# Patient Record
Sex: Female | Born: 1990 | Race: White | Hispanic: No | Marital: Single | Smoking: Former smoker
Health system: Southern US, Community
[De-identification: ages and names within clinical notes are randomized; demographics above are authoritative.]

## PROBLEM LIST (undated history)

## (undated) DIAGNOSIS — A6000 Herpesviral infection of urogenital system, unspecified: Secondary | ICD-10-CM

## (undated) DIAGNOSIS — F419 Anxiety disorder, unspecified: Secondary | ICD-10-CM

## (undated) DIAGNOSIS — F32A Depression, unspecified: Secondary | ICD-10-CM

## (undated) DIAGNOSIS — F329 Major depressive disorder, single episode, unspecified: Secondary | ICD-10-CM

## (undated) HISTORY — DX: Depression, unspecified: F32.A

## (undated) HISTORY — DX: Anxiety disorder, unspecified: F41.9

## (undated) HISTORY — DX: Major depressive disorder, single episode, unspecified: F32.9

---

## 2008-08-14 HISTORY — PX: CHOLECYSTECTOMY: SHX55

## 2015-03-01 ENCOUNTER — Ambulatory Visit: Payer: Self-pay | Admitting: Physician Assistant

## 2015-03-01 ENCOUNTER — Encounter: Payer: Self-pay | Admitting: Physician Assistant

## 2015-03-01 VITALS — BP 92/66 | HR 76 | Temp 97.9°F | Ht 62.5 in | Wt 199.2 lb

## 2015-03-01 DIAGNOSIS — F17210 Nicotine dependence, cigarettes, uncomplicated: Secondary | ICD-10-CM

## 2015-03-01 DIAGNOSIS — Z131 Encounter for screening for diabetes mellitus: Secondary | ICD-10-CM

## 2015-03-01 DIAGNOSIS — E669 Obesity, unspecified: Secondary | ICD-10-CM

## 2015-03-01 DIAGNOSIS — Z13220 Encounter for screening for lipoid disorders: Secondary | ICD-10-CM

## 2015-03-01 DIAGNOSIS — F411 Generalized anxiety disorder: Secondary | ICD-10-CM

## 2015-03-01 DIAGNOSIS — F418 Other specified anxiety disorders: Secondary | ICD-10-CM | POA: Insufficient documentation

## 2015-03-01 DIAGNOSIS — M25562 Pain in left knee: Secondary | ICD-10-CM | POA: Insufficient documentation

## 2015-03-01 LAB — GLUCOSE, POCT (MANUAL RESULT ENTRY): POC Glucose: 93 mg/dl (ref 70–99)

## 2015-03-01 NOTE — Progress Notes (Signed)
BP 92/66 mmHg  Pulse 76  Temp(Src) 97.9 F (36.6 C)  Ht 5' 2.5" (1.588 m)  Wt 199 lb 4 oz (90.379 kg)  BMI 35.84 kg/m2  SpO2 98%   Subjective:    Patient ID: Kelli Barker, female    DOB: 05-Aug-1990, 25 y.o.   MRN: [id number]  HPI: Kelli Barker is a 25 y.o. female presenting on 03/01/2015 for New Patient (Initial Visit); Knee Pain; and Dental Problem   HPI  Chief Complaint  Patient presents with  . New Patient (Initial Visit)    last time pt saw a pcp was 8 years ago  . Knee Pain    L knee  . Dental Problem    Pt used to go to [facility] but she hasn't been there since May.  She stopped going there when her therapist left.  Says her anxiety has been flaring a bit.  Pt states knee pain off and on her whole life.  States worse since 2015.  Says it swells every day.  States throbs after waslking.  Pain on medial side, now -   hyurts medially when walking but at night aches laterally.  She has never had her knee seen/evaluated except once when she was 10 or 25 years old.  Relevant past medical, surgical, family and social history reviewed and updated as indicated. Interim medical history since our last visit reviewed. Allergies and medications reviewed and updated.   Current outpatient prescriptions:  .  ibuprofen (ADVIL,MOTRIN) 200 MG tablet, Take 800 mg by mouth daily., Disp: , Rfl:    Review of Systems  Constitutional: Negative for fever, chills, diaphoresis, appetite change, fatigue and unexpected weight change.  HENT: Positive for congestion, dental problem, sneezing and sore throat. Negative for drooling, ear pain, facial swelling, hearing loss, mouth sores, trouble swallowing and voice change.   Eyes: Negative for pain, discharge, redness, itching and visual disturbance.  Respiratory: Negative for cough, choking, shortness of breath and wheezing.   Cardiovascular: Positive for leg swelling. Negative for chest pain and palpitations.  Gastrointestinal: Negative  for vomiting, abdominal pain, diarrhea, constipation and blood in stool.  Endocrine: Positive for polydipsia. Negative for cold intolerance and heat intolerance.  Genitourinary: Negative for dysuria, hematuria and decreased urine volume.  Musculoskeletal: Negative for back pain, arthralgias and gait problem.  Skin: Negative for rash.  Allergic/Immunologic: Negative for environmental allergies.  Neurological: Positive for headaches. Negative for seizures, syncope and light-headedness.  Hematological: Negative for adenopathy.  Psychiatric/Behavioral: Positive for dysphoric mood. Negative for suicidal ideas and agitation. The patient is nervous/anxious.     Per HPI unless specifically indicated above     Objective:    BP 92/66 mmHg  Pulse 76  Temp(Src) 97.9 F (36.6 C)  Ht 5' 2.5" (1.588 m)  Wt 199 lb 4 oz (90.379 kg)  BMI 35.84 kg/m2  SpO2 98%  Wt Readings from Last 3 Encounters:  03/01/15 199 lb 4 oz (90.379 kg)    Physical Exam  Constitutional: She is oriented to person, place, and time. She appears well-developed and well-nourished.  HENT:  Head: Normocephalic and atraumatic.  Mouth/Throat: Oropharynx is clear and moist. No oropharyngeal exudate.  Eyes: Conjunctivae and EOM are normal. Pupils are equal, round, and reactive to light.  Neck: Neck supple. No thyromegaly present.  Cardiovascular: Normal rate and regular rhythm.   Pulses:      Dorsalis pedis pulses are 2+ on the right side, and 2+ on the left side.  Posterior tibial pulses are 2+ on the right side, and 2+ on the left side.  Pulmonary/Chest: Effort normal and breath sounds normal.  Abdominal: Soft. Bowel sounds are normal. She exhibits no mass. There is no hepatosplenomegaly. There is no tenderness.  Musculoskeletal: She exhibits no edema.       Left knee: She exhibits normal range of motion, no swelling, no effusion, no ecchymosis, no deformity and no erythema. Tenderness (mild lateral non-point tenderness)  found.  Lymphadenopathy:    She has no cervical adenopathy.  Neurological: She is alert and oriented to person, place, and time. Gait normal.  Skin: Skin is warm and dry.  Psychiatric: She has a normal mood and affect. Her behavior is normal.  Vitals reviewed.   Results for orders placed or performed in visit on 03/01/15  POCT Glucose (CBG)  Result Value Ref Range   POC Glucose 93 70 - 99 mg/dl      Assessment & Plan:   Encounter Diagnoses  Name Primary?  . Left knee pain Yes  . Anxiety state   . Obesity, unspecified   . Screening cholesterol level   . Screening for diabetes mellitus   . Cigarette nicotine dependence, uncomplicated     -pt to get Baseline labs drawn -she is given [facility] discount  application -put on Dental list -order xray L knee.  Counseled pt to use otc knee sleeve to provide support. Ice the knee after walking or other activity.  Discussed weight management which will help the knee also -encouraged pt to Return to [facility] for anxiety -pt counseled on contraception.  Needs to at least use condoms if not desiring pregnancy -counseled on smoking cessation -F/u 1 month

## 2015-03-29 ENCOUNTER — Ambulatory Visit: Payer: Self-pay | Admitting: Physician Assistant

## 2015-04-13 ENCOUNTER — Ambulatory Visit: Payer: Self-pay | Admitting: Physician Assistant

## 2015-10-07 LAB — BASIC METABOLIC PANEL: Glucose: 95 mg/dL

## 2015-11-29 DIAGNOSIS — Z139 Encounter for screening, unspecified: Secondary | ICD-10-CM

## 2015-11-29 NOTE — Congregational Nurse Program (Signed)
Patient had a scheduled appointment at 10 am to be give a flu vaccine as well as setting her up with a primary care provider. Patient vitals were 101/65 73 temperature of 98.2 via oral source.  Patient was given the vaccine information sheet, and both consent form one with the pictures and other with just wording. Flu vaccine was give on left upper arm deltoid area. Vaccine information: LOT: [id number]  MFG: [employer] NDC: [id number] Expires: 06/20/16. [facility] and [facility] was made for 12/05/15 at 8:30 am.    Elane FritzBlanca R. Cheyann Blecha LPN [phone]

## 2015-12-05 ENCOUNTER — Encounter: Payer: Self-pay | Admitting: Physician Assistant

## 2015-12-05 ENCOUNTER — Ambulatory Visit: Payer: Self-pay | Admitting: Physician Assistant

## 2015-12-05 VITALS — BP 100/64 | HR 77 | Temp 98.1°F | Ht 62.5 in | Wt 182.5 lb

## 2015-12-05 DIAGNOSIS — Z130 Encounter for screening for diseases of the blood and blood-forming organs and certain disorders involving the immune mechanism: Secondary | ICD-10-CM

## 2015-12-05 DIAGNOSIS — Z6832 Body mass index (BMI) 32.0-32.9, adult: Principal | ICD-10-CM

## 2015-12-05 DIAGNOSIS — Z131 Encounter for screening for diabetes mellitus: Secondary | ICD-10-CM

## 2015-12-05 DIAGNOSIS — Z13220 Encounter for screening for lipoid disorders: Secondary | ICD-10-CM

## 2015-12-05 DIAGNOSIS — E669 Obesity, unspecified: Secondary | ICD-10-CM

## 2015-12-05 LAB — CBC WITH DIFFERENTIAL/PLATELET
BASOS PCT: 0 %
Basophils Absolute: 0 cells/uL (ref 0–200)
EOS ABS: 183 {cells}/uL (ref 15–500)
Eosinophils Relative: 3 %
HCT: 36.9 % (ref 35.0–45.0)
Hemoglobin: 12.1 g/dL (ref 11.7–15.5)
Lymphocytes Relative: 23 %
Lymphs Abs: 1403 cells/uL (ref 850–3900)
MCH: 27.6 pg (ref 27.0–33.0)
MCHC: 32.8 g/dL (ref 32.0–36.0)
MCV: 84.1 fL (ref 80.0–100.0)
MONO ABS: 366 {cells}/uL (ref 200–950)
MONOS PCT: 6 %
MPV: 10.7 fL (ref 7.5–12.5)
NEUTROS ABS: 4148 {cells}/uL (ref 1500–7800)
Neutrophils Relative %: 68 %
PLATELETS: 206 10*3/uL (ref 140–400)
RBC: 4.39 MIL/uL (ref 3.80–5.10)
RDW: 13.5 % (ref 11.0–15.0)
WBC: 6.1 10*3/uL (ref 3.8–10.8)

## 2015-12-05 LAB — COMPREHENSIVE METABOLIC PANEL
ALBUMIN: 4.3 g/dL (ref 3.6–5.1)
ALT: 8 U/L (ref 6–29)
AST: 11 U/L (ref 10–30)
Alkaline Phosphatase: 33 U/L (ref 33–115)
BUN: 15 mg/dL (ref 7–25)
CALCIUM: 9.2 mg/dL (ref 8.6–10.2)
CHLORIDE: 105 mmol/L (ref 98–110)
CO2: 23 mmol/L (ref 20–31)
Creat: 0.72 mg/dL (ref 0.50–1.10)
Glucose, Bld: 82 mg/dL (ref 65–99)
Potassium: 4.2 mmol/L (ref 3.5–5.3)
Sodium: 137 mmol/L (ref 135–146)
Total Bilirubin: 0.6 mg/dL (ref 0.2–1.2)
Total Protein: 6.6 g/dL (ref 6.1–8.1)

## 2015-12-05 LAB — LIPID PANEL
CHOL/HDL RATIO: 3.2 ratio (ref <5.0)
Cholesterol: 135 mg/dL (ref <200)
HDL: 42 mg/dL — AB (ref >50)
LDL Cholesterol: 78 mg/dL (ref <100)
TRIGLYCERIDES: 74 mg/dL (ref <150)
VLDL: 15 mg/dL (ref <30)

## 2015-12-05 LAB — HEMOGLOBIN A1C
HEMOGLOBIN A1C: 4.9 % (ref <5.7)
Mean Plasma Glucose: 94 mg/dL

## 2015-12-05 LAB — TSH: TSH: 2.11 mIU/L

## 2015-12-05 NOTE — Progress Notes (Signed)
BP 100/64 (BP Location: Left Arm, Patient Position: Sitting, Cuff Size: Normal)   Pulse 77   Temp 98.1 F (36.7 C) (Other (Comment))   Ht 5' 2.5" (1.588 m)   Wt 182 lb 8 oz (82.8 kg)   LMP 11/22/2015 (Approximate)   SpO2 98%   BMI 32.85 kg/m    Subjective:    Patient ID: Kelli Barker, female    DOB: 1990/04/22, 25 y.o.   MRN: [id number]  HPI: Kelli Barker is a 25 y.o. female presenting on 12/05/2015 for New Patient (Initial Visit) (new/old patient)   HPI -Pt seen here in February but pt never got labs or followed up. -[facility] -No longer having knee pain -No longer having anxiety -she Quit smoking earlier this year -She is working on weight loss with nutritious diet and exercise  Relevant past medical, surgical, family and social history reviewed and updated as indicated. Interim medical history since our last visit reviewed. Allergies and medications reviewed and updated.   Current Outpatient Prescriptions:  .  ibuprofen (ADVIL,MOTRIN) 200 MG tablet, Take 800 mg by mouth daily as needed. , Disp: , Rfl:   Review of Systems  Constitutional: Negative for appetite change, chills, diaphoresis, fatigue, fever and unexpected weight change.  HENT: Positive for dental problem, sneezing and sore throat. Negative for congestion, drooling, ear pain, facial swelling, hearing loss, mouth sores, trouble swallowing and voice change.   Eyes: Negative for pain, discharge, redness, itching and visual disturbance.  Respiratory: Positive for cough. Negative for choking, shortness of breath and wheezing.   Cardiovascular: Negative for chest pain, palpitations and leg swelling.  Gastrointestinal: Negative for abdominal pain, blood in stool, constipation, diarrhea and vomiting.  Endocrine: Negative for cold intolerance, heat intolerance and polydipsia.  Genitourinary: Negative for decreased urine volume, dysuria and hematuria.  Musculoskeletal: Negative for arthralgias,  back pain and gait problem.  Skin: Negative for rash.  Allergic/Immunologic: Negative for environmental allergies.  Neurological: Negative for seizures, syncope, light-headedness and headaches.  Hematological: Negative for adenopathy.  Psychiatric/Behavioral: Negative for agitation, dysphoric mood and suicidal ideas. The patient is nervous/anxious.     Per HPI unless specifically indicated above     Objective:    BP 100/64 (BP Location: Left Arm, Patient Position: Sitting, Cuff Size: Normal)   Pulse 77   Temp 98.1 F (36.7 C) (Other (Comment))   Ht 5' 2.5" (1.588 m)   Wt 182 lb 8 oz (82.8 kg)   LMP 11/22/2015 (Approximate)   SpO2 98%   BMI 32.85 kg/m   Wt Readings from Last 3 Encounters:  12/05/15 182 lb 8 oz (82.8 kg)  11/29/15 187 lb (84.8 kg)  03/01/15 199 lb 4 oz (90.4 kg)    Physical Exam  Constitutional: She is oriented to person, place, and time. She appears well-developed and well-nourished.  HENT:  Head: Normocephalic and atraumatic.  Mouth/Throat: Oropharynx is clear and moist. No oropharyngeal exudate.  Eyes: Conjunctivae and EOM are normal. Pupils are equal, round, and reactive to light.  Neck: Neck supple. No thyromegaly present.  Cardiovascular: Normal rate and regular rhythm.   Pulmonary/Chest: Effort normal and breath sounds normal.  Abdominal: Soft. Bowel sounds are normal. She exhibits no mass. There is no hepatosplenomegaly. There is no tenderness.  Musculoskeletal: She exhibits no edema.  Lymphadenopathy:    She has no cervical adenopathy.  Neurological: She is alert and oriented to person, place, and time. Gait normal.  Skin: Skin is warm and dry.  Psychiatric: She  has a normal mood and affect. Her behavior is normal.  Vitals reviewed.   Results for orders placed or performed in visit on 10/07/15  Basic metabolic panel  Result Value Ref Range   Glucose 95 mg/dL      Assessment & Plan:    Encounter Diagnoses  Name Primary?  . Class 1  obesity with body mass index (BMI) of 32.0 to 32.9 in adult, unspecified obesity type, unspecified whether serious comorbidity present Yes  . Screening cholesterol level   . Screening for diabetes mellitus   . Screening, anemia, deficiency, iron     -get baseline labs. Will call with results -[facility] -follow up 6 months.  RTO sooner prn

## 2016-01-13 ENCOUNTER — Emergency Department (HOSPITAL_COMMUNITY)
Admission: EM | Admit: 2016-01-13 | Discharge: 2016-01-13 | Disposition: A | Discharge status: Home / Self Care | Payer: Self-pay | Attending: Emergency Medicine | Admitting: Emergency Medicine

## 2016-01-13 ENCOUNTER — Encounter (HOSPITAL_COMMUNITY): Payer: Self-pay | Admitting: *Deleted

## 2016-01-13 DIAGNOSIS — Z87891 Personal history of nicotine dependence: Secondary | ICD-10-CM | POA: Insufficient documentation

## 2016-01-13 DIAGNOSIS — J069 Acute upper respiratory infection, unspecified: Secondary | ICD-10-CM | POA: Insufficient documentation

## 2016-01-13 DIAGNOSIS — H9201 Otalgia, right ear: Secondary | ICD-10-CM

## 2016-01-13 MED ORDER — GUAIFENESIN-CODEINE 100-10 MG/5ML PO SYRP: 10.0000 mL | ORAL_SOLUTION | Freq: Three times a day (TID) | ORAL | 0 refills | Status: DC | PRN

## 2016-01-13 MED ORDER — PREDNISONE 10 MG PO TABS: ORAL_TABLET | ORAL | 0 refills | Status: DC

## 2016-01-13 NOTE — ED Triage Notes (Signed)
Pt is her for right ear pain which occurred suddenly today right after she heard a pop in her ear (?from jaw/tmj?).  Pt now reports pain in right ear radiating to right jaw. No fever or chills

## 2016-01-13 NOTE — Discharge Instructions (Signed)
Take sudafed as directed.  Plenty of fluids.  Follow-up with your doctor for recheck

## 2016-01-13 NOTE — ED Notes (Signed)
Pt with cold symptoms since 12/26-nasal congestion, cough, sinus and ear pressure esp to right ear.  Today right ear popped and pain since along her right jaw

## 2016-01-17 NOTE — ED Provider Notes (Signed)
[facility]-EMERGENCY DEPT Provider Note   CSN: [id number] Arrival date & time: 01/13/16  1301     History   Chief Complaint Chief Complaint  Patient presents with  . Ear Pain    HPI Kelli Barker is a 26 y.o. female.  HPI  Kelli Barker is a 27 y.o. female who presents to the Emergency Department complaining of nasal congestion, cough, ear pain and pressure for several days.  She states that she felt a sudden pop to her ear shortly prior to arrival.  She describes a sharp pain along her ear and right jaw since then.  She denies fever, chills, neck pain and facial swelling  Past Medical History:  Diagnosis Date  . Anxiety   . Depression     Patient Active Problem List   Diagnosis Date Noted  . Left knee pain 03/01/2015  . Anxiety state 03/01/2015  . Obesity, unspecified 03/01/2015  . Cigarette nicotine dependence, uncomplicated 03/01/2015    Past Surgical History:  Procedure Laterality Date  . CHOLECYSTECTOMY  08-2008    OB History    No data available       Home Medications    Prior to Admission medications   Medication Sig Start Date End Date Taking? Authorizing Provider  guaiFENesin-codeine (ROBITUSSIN AC) 100-10 MG/5ML syrup Take 10 mLs by mouth 3 (three) times daily as needed. 01/13/16   Odelle Kosier, PA-C  ibuprofen (ADVIL,MOTRIN) 200 MG tablet Take 800 mg by mouth daily as needed.     Historical Provider, MD  predniSONE (DELTASONE) 10 MG tablet Take 6 tablets day one, 5 tablets day two, 4 tablets day three, 3 tablets day four, 2 tablets day five, then 1 tablet day six 01/13/16   Pauline Aus, PA-C    Family History Family History  Problem Relation Age of Onset  . COPD Father   . Hypertension Maternal Grandfather     Social History Social History  Substance Use Topics  . Smoking status: Former Smoker    Years: 10.00    Types: Cigarettes    Quit date: 01/15/2015  . Smokeless tobacco: Never Used  . Alcohol use No     Allergies   Sulfa  antibiotics   Review of Systems Review of Systems  Constitutional: Negative for activity change, appetite change, chills and fever.  HENT: Positive for congestion, ear pain, rhinorrhea and sore throat. Negative for facial swelling and trouble swallowing.   Eyes: Negative for visual disturbance.  Respiratory: Positive for cough. Negative for shortness of breath, wheezing and stridor.   Gastrointestinal: Negative for abdominal pain, nausea and vomiting.  Musculoskeletal: Negative for neck pain and neck stiffness.  Skin: Negative.   Neurological: Negative for dizziness, weakness, numbness and headaches.  Hematological: Negative for adenopathy.  Psychiatric/Behavioral: Negative for confusion.  All other systems reviewed and are negative.    Physical Exam Updated Vital Signs BP 95/56 (BP Location: Right Arm)   Pulse 76   Temp 98.4 F (36.9 C) (Oral)   Resp 16   Wt 84.8 kg   LMP 01/03/2016 (Approximate)   SpO2 100%   BMI 33.66 kg/m   Physical Exam  Constitutional: She is oriented to person, place, and time. She appears well-developed and well-nourished. No distress.  HENT:  Head: Normocephalic and atraumatic.  Right Ear: Ear canal normal. Tympanic membrane is retracted. No middle ear effusion.  Left Ear: Tympanic membrane and ear canal normal.  Nose: Mucosal edema and rhinorrhea present.  Mouth/Throat: Uvula is midline and mucous membranes are normal.  No trismus in the jaw. No uvula swelling. Posterior oropharyngeal erythema present. No oropharyngeal exudate, posterior oropharyngeal edema or tonsillar abscesses.  Eyes: Conjunctivae are normal.  Neck: Normal range of motion and phonation normal. Neck supple. No Brudzinski's sign and no Kernig's sign noted.  Cardiovascular: Normal rate, regular rhythm, normal heart sounds and intact distal pulses.   No murmur heard. Pulmonary/Chest: Effort normal and breath sounds normal. No respiratory distress. She has no wheezes. She has no  rales.  Abdominal: Soft. She exhibits no distension. There is no tenderness. There is no rebound and no guarding.  Musculoskeletal: She exhibits no edema.  Lymphadenopathy:    She has no cervical adenopathy.  Neurological: She is alert and oriented to person, place, and time. She exhibits normal muscle tone. Coordination normal.  Skin: Skin is warm and dry.  Nursing note and vitals reviewed.    ED Treatments / Results  Labs (all labs ordered are listed, but only abnormal results are displayed) Labs Reviewed - No data to display  EKG  EKG Interpretation None       Radiology No results found.  Procedures Procedures (including critical care time)  Medications Ordered in ED Medications - No data to display   Initial Impression / Assessment and Plan / ED Course  I have reviewed the triage vital signs and the nursing notes.  Pertinent labs & imaging results that were available during my care of the patient were reviewed by me and considered in my medical decision making (see chart for details).  Clinical Course    Pt well appearing, non-toxic.  Vitals stable.  Sx's likely related to URI.  No OM.  no mastoid tenderness.  Pt agrees to symptomatic tx.    Final Clinical Impressions(s) / ED Diagnoses   Final diagnoses:  Otalgia of right ear  Upper respiratory tract infection, unspecified type    New Prescriptions Discharge Medication List as of 01/13/2016  3:22 PM    START taking these medications   Details  guaiFENesin-codeine (ROBITUSSIN AC) 100-10 MG/5ML syrup Take 10 mLs by mouth 3 (three) times daily as needed., Starting Sat 01/13/2016, Print    predniSONE (DELTASONE) 10 MG tablet Take 6 tablets day one, 5 tablets day two, 4 tablets day three, 3 tablets day four, 2 tablets day five, then 1 tablet day six, Print         Pauline Ausammy Gwendlyn Hanback, PA-C 01/17/16 2153    Maia PlanJoshua G Long, MD 01/18/16 [phone]

## 2016-01-30 ENCOUNTER — Ambulatory Visit: Payer: Self-pay | Admitting: Physician Assistant

## 2016-01-30 ENCOUNTER — Encounter: Payer: Self-pay | Admitting: Physician Assistant

## 2016-01-30 VITALS — BP 102/74 | HR 93 | Temp 98.1°F | Ht 62.5 in | Wt 184.5 lb

## 2016-01-30 DIAGNOSIS — J069 Acute upper respiratory infection, unspecified: Secondary | ICD-10-CM

## 2016-01-30 DIAGNOSIS — K0889 Other specified disorders of teeth and supporting structures: Secondary | ICD-10-CM

## 2016-01-30 MED ORDER — AMOXICILLIN 500 MG PO CAPS: 500.0000 mg | ORAL_CAPSULE | Freq: Three times a day (TID) | ORAL | 0 refills | Status: AC

## 2016-01-30 MED ORDER — BENZONATATE 100 MG PO CAPS: ORAL_CAPSULE | ORAL | 3 refills | Status: AC

## 2016-01-30 NOTE — Patient Instructions (Signed)
Upper Respiratory Infection, Adult Most upper respiratory infections (URIs) are a viral infection of the air passages leading to the lungs. A URI affects the nose, throat, and upper air passages. The most common type of URI is nasopharyngitis and is typically referred to as "the common cold." URIs run their course and usually go away on their own. Most of the time, a URI does not require medical attention, but sometimes a bacterial infection in the upper airways can follow a viral infection. This is called a secondary infection. Sinus and middle ear infections are common types of secondary upper respiratory infections. Bacterial pneumonia can also complicate a URI. A URI can worsen asthma and chronic obstructive pulmonary disease (COPD). Sometimes, these complications can require emergency medical care and may be life threatening. What are the causes? Almost all URIs are caused by viruses. A virus is a type of germ and can spread from one person to another. What increases the risk? You may be at risk for a URI if:  You smoke.  You have chronic heart or lung disease.  You have a weakened defense (immune) system.  You are very young or very old.  You have nasal allergies or asthma.  You work in crowded or poorly ventilated areas.  You work in health care facilities or schools.  What are the signs or symptoms? Symptoms typically develop 2-3 days after you come in contact with a cold virus. Most viral URIs last 7-10 days. However, viral URIs from the influenza virus (flu virus) can last 14-18 days and are typically more severe. Symptoms may include:  Runny or stuffy (congested) nose.  Sneezing.  Cough.  Sore throat.  Headache.  Fatigue.  Fever.  Loss of appetite.  Pain in your forehead, behind your eyes, and over your cheekbones (sinus pain).  Muscle aches.  How is this diagnosed? Your health care provider may diagnose a URI by:  Physical exam.  Tests to check that your  symptoms are not due to another condition such as: ? Strep throat. ? Sinusitis. ? Pneumonia. ? Asthma.  How is this treated? A URI goes away on its own with time. It cannot be cured with medicines, but medicines may be prescribed or recommended to relieve symptoms. Medicines may help:  Reduce your fever.  Reduce your cough.  Relieve nasal congestion.  Follow these instructions at home:  Take medicines only as directed by your health care provider.  Gargle warm saltwater or take cough drops to comfort your throat as directed by your health care provider.  Use a warm mist humidifier or inhale steam from a shower to increase air moisture. This may make it easier to breathe.  Drink enough fluid to keep your urine clear or pale yellow.  Eat soups and other clear broths and maintain good nutrition.  Rest as needed.  Return to work when your temperature has returned to normal or as your health care provider advises. You may need to stay home longer to avoid infecting others. You can also use a face mask and careful hand washing to prevent spread of the virus.  Increase the usage of your inhaler if you have asthma.  Do not use any tobacco products, including cigarettes, chewing tobacco, or electronic cigarettes. If you need help quitting, ask your health care provider. How is this prevented? The best way to protect yourself from getting a cold is to practice good hygiene.  Avoid oral or hand contact with people with cold symptoms.  Wash your   hands often if contact occurs.  There is no clear evidence that vitamin C, vitamin E, echinacea, or exercise reduces the chance of developing a cold. However, it is always recommended to get plenty of rest, exercise, and practice good nutrition. Contact a health care provider if:  You are getting worse rather than better.  Your symptoms are not controlled by medicine.  You have chills.  You have worsening shortness of breath.  You have  brown or red mucus.  You have yellow or brown nasal discharge.  You have pain in your face, especially when you bend forward.  You have a fever.  You have swollen neck glands.  You have pain while swallowing.  You have white areas in the back of your throat. Get help right away if:  You have severe or persistent: ? Headache. ? Ear pain. ? Sinus pain. ? Chest pain.  You have chronic lung disease and any of the following: ? Wheezing. ? Prolonged cough. ? Coughing up blood. ? A change in your usual mucus.  You have a stiff neck.  You have changes in your: ? Vision. ? Hearing. ? Thinking. ? Mood. This information is not intended to replace advice given to you by your health care provider. Make sure you discuss any questions you have with your health care provider. Document Released: 06/26/2000 Document Revised: 09/03/2015 Document Reviewed: 04/07/2013 Elsevier Interactive Patient Education  2017 [employer]  

## 2016-01-30 NOTE — Progress Notes (Signed)
BP 102/74 (BP Location: Left Arm, Patient Position: Sitting, Cuff Size: Large)   Pulse 93   Temp 98.1 F (36.7 C) (Other (Comment))   Ht 5' 2.5" (1.588 m)   Wt 184 lb 8 oz (83.7 kg)   LMP 01/22/2016 (Approximate)   SpO2 97%   BMI 33.21 kg/m    Subjective:    Patient ID: Kelli Barker, female    DOB: 01/04/1991, 26 y.o.   MRN: [id number]  HPI: Kelli BelfastJessica Hogenson is a 26 y.o. female presenting on 01/30/2016 for Facial Pain (c/o pain to right jaw and face since broke tooth several weeks ago, [facility],) and Cough (also had cough producing green mucus, and headache, some relief from pain and headache with excedrin migraine)   HPI   Chief Complaint  Patient presents with  . Facial Pain    c/o pain to right jaw and face since broke tooth several weeks ago, [facility],  . Cough    also had cough producing green mucus, and headache, some relief from pain and headache with excedrin migraine     Pt seen in ER for same  Relevant past medical, surgical, family and social history reviewed and updated as indicated. Interim medical history since our last visit reviewed. Allergies and medications reviewed and updated.  . Current Outpatient Prescriptions:  .  aspirin-acetaminophen-caffeine (EXCEDRIN MIGRAINE) 250-250-65 MG tablet, Take 2 tablets by mouth daily as needed for headache., Disp: , Rfl:  .  ibuprofen (ADVIL,MOTRIN) 200 MG tablet, Take 800 mg by mouth daily as needed. , Disp: , Rfl:  .  phenylephrine (SUDAFED PE) 10 MG TABS tablet, Take 10 mg by mouth every morning., Disp: , Rfl:    Review of Systems  Constitutional: Negative for appetite change, chills, diaphoresis, fatigue, fever and unexpected weight change.  HENT: Positive for dental problem, ear pain, sneezing and sore throat. Negative for congestion, drooling, facial swelling, hearing loss, mouth sores, trouble swallowing and voice change.   Eyes: Positive for itching. Negative for pain,  discharge, redness and visual disturbance.  Respiratory: Positive for cough. Negative for choking, shortness of breath and wheezing.   Cardiovascular: Negative for chest pain, palpitations and leg swelling.  Gastrointestinal: Negative for abdominal pain, blood in stool, constipation, diarrhea and vomiting.  Endocrine: Negative for cold intolerance, heat intolerance and polydipsia.  Genitourinary: Negative for decreased urine volume, dysuria and hematuria.  Musculoskeletal: Negative for arthralgias, back pain and gait problem.  Skin: Negative for rash.  Allergic/Immunologic: Negative for environmental allergies.  Neurological: Positive for headaches. Negative for seizures, syncope and light-headedness.  Hematological: Negative for adenopathy.  Psychiatric/Behavioral: Negative for agitation, dysphoric mood and suicidal ideas. The patient is not nervous/anxious.     Per HPI unless specifically indicated above     Objective:    BP 102/74 (BP Location: Left Arm, Patient Position: Sitting, Cuff Size: Large)   Pulse 93   Temp 98.1 F (36.7 C) (Other (Comment))   Ht 5' 2.5" (1.588 m)   Wt 184 lb 8 oz (83.7 kg)   LMP 01/22/2016 (Approximate)   SpO2 97%   BMI 33.21 kg/m   Wt Readings from Last 3 Encounters:  01/30/16 184 lb 8 oz (83.7 kg)  01/13/16 187 lb (84.8 kg)  12/05/15 182 lb 8 oz (82.8 kg)    Physical Exam  Constitutional: She is oriented to person, place, and time. She appears well-developed and well-nourished.  HENT:  Head: Normocephalic and atraumatic.  Right  Ear: Hearing, tympanic membrane, external ear and ear canal normal.  Left Ear: Hearing, tympanic membrane, external ear and ear canal normal.  Nose: Mucosal edema and rhinorrhea present.  Mouth/Throat: Uvula is midline and oropharynx is clear and moist. No trismus in the jaw. Abnormal dentition. Dental caries present. No dental abscesses or uvula swelling. No oropharyngeal exudate.  Neck: Neck supple.  Cardiovascular:  Normal rate and regular rhythm.   Pulmonary/Chest: Effort normal and breath sounds normal. She has no wheezes.  Lymphadenopathy:    She has no cervical adenopathy.  Neurological: She is alert and oriented to person, place, and time.  Skin: Skin is warm and dry.  Psychiatric: She has a normal mood and affect. Her behavior is normal.  Vitals reviewed.       Assessment & Plan:   Encounter Diagnoses  Name Primary?  . Acute upper respiratory infection Yes  . Dentalgia     -rx amoxil and tessalon -encouraged rest, fluids -discussed with pt that she needs to see dentist.  Discussed that antibiotic should resolve any bacterial sinusitis as well as any infection in her teeth, but that she needs dentist for definitive treatment of her tooth problems. -follow up may as scheduled.  RTO soooner prn

## 2016-05-02 ENCOUNTER — Emergency Department (HOSPITAL_COMMUNITY)
Admission: EM | Admit: 2016-05-02 | Discharge: 2016-05-02 | Disposition: A | Discharge status: Home / Self Care | Payer: Self-pay | Attending: Emergency Medicine | Admitting: Emergency Medicine

## 2016-05-02 ENCOUNTER — Encounter (HOSPITAL_COMMUNITY): Payer: Self-pay | Admitting: Emergency Medicine

## 2016-05-02 DIAGNOSIS — Z79899 Other long term (current) drug therapy: Secondary | ICD-10-CM | POA: Insufficient documentation

## 2016-05-02 DIAGNOSIS — K0889 Other specified disorders of teeth and supporting structures: Secondary | ICD-10-CM | POA: Insufficient documentation

## 2016-05-02 DIAGNOSIS — Z87891 Personal history of nicotine dependence: Secondary | ICD-10-CM | POA: Insufficient documentation

## 2016-05-02 MED ORDER — PENICILLIN V POTASSIUM 250 MG PO TABS
500.0000 mg | ORAL_TABLET | Freq: Once | ORAL | Status: AC
  Administered 2016-05-02: 500 mg via ORAL
  Filled 2016-05-02: qty 2

## 2016-05-02 MED ORDER — PENICILLIN V POTASSIUM 500 MG PO TABS: 500.0000 mg | ORAL_TABLET | Freq: Four times a day (QID) | ORAL | 0 refills | Status: AC

## 2016-05-02 MED ORDER — HYDROCODONE-ACETAMINOPHEN 5-325 MG PO TABS
1.0000 | ORAL_TABLET | Freq: Once | ORAL | Status: AC
  Administered 2016-05-02: 1 via ORAL
  Filled 2016-05-02: qty 1

## 2016-05-02 NOTE — ED Provider Notes (Signed)
[facility]-EM[facility] DEPT Provider Note   CSN: [id number] Arrival date & time: 05/02/16  2228  By signing my name below, I, Doreatha Martin, attest that this documentation has been prepared under the direction and in the presence of Zadie Rhine, MD. Electronically Signed: Doreatha Martin, ED Scribe. 05/02/16. 11:36 PM.     History   Chief Complaint Chief Complaint  Patient presents with  . Dental Pain    HPI Kelli Barker is a 26 y.o. female who presents to the Emergency Department complaining of constant, gradually worsening right lower dental pain that began last night. Per pt, her pain is worsened with swallowing and talking. Pt states she has taken ibuprofen with no relief. She notes h/o similar pain that was treated with antibiotics. She denies fever, vomiting, difficulty swallowing or tolerating secretions. NKDA.   The history is provided by the patient. No language interpreter was used.  Dental Pain   This is a recurrent problem. The current episode started 2 days ago. The problem occurs constantly. The problem has been gradually worsening. The pain is moderate. Treatments tried: Motrin. The treatment provided no relief.    Past Medical History:  Diagnosis Date  . Anxiety   . Depression     Patient Active Problem List   Diagnosis Date Noted  . Left knee pain 03/01/2015  . Anxiety state 03/01/2015  . Obesity, unspecified 03/01/2015  . Cigarette nicotine dependence, uncomplicated 03/01/2015    Past Surgical History:  Procedure Laterality Date  . CHOLECYSTECTOMY  08-2008    OB History    No data available       Home Medications    Prior to Admission medications   Medication Sig Start Date End Date Taking? Authorizing Provider  Aspirin-Salicylamide-Caffeine (BC HEADACHE) 325-95-16 MG TABS Take 1 Package by mouth once as needed (for dental pain).   Yes Historical Provider, MD  ibuprofen (ADVIL,MOTRIN) 200 MG tablet Take 800 mg by mouth daily as needed.    Yes  Historical Provider, MD    Family History Family History  Problem Relation Age of Onset  . COPD Father   . Hypertension Maternal Grandfather     Social History Social History  Substance Use Topics  . Smoking status: Former Smoker    Years: 10.00    Types: Cigarettes    Quit date: 01/15/2015  . Smokeless tobacco: Never Used  . Alcohol use No     Allergies   Sulfa antibiotics   Review of Systems Review of Systems  Constitutional: Negative for fever.  HENT: Positive for dental problem. Negative for trouble swallowing.   Gastrointestinal: Negative for vomiting.     Physical Exam Updated Vital Signs BP 121/61 (BP Location: Left Arm)   Pulse 78   Temp 98.8 F (37.1 C) (Oral)   Resp 20   Ht  (1.575 m)   Wt 180 lb (81.6 kg)   LMP 05/01/2016   SpO2 100%   BMI 32.92 kg/m   Physical Exam CONSTITUTIONAL: Well developed/well nourished HEAD AND FACE: Normocephalic/atraumatic EYES: EOMI/PERRL ENMT: Mucous membranes moist.  Poor dentition.  No trismus.  No focal abscess noted. Tenderness to right lower molar.  NECK: supple no meningeal signs CV: S1/S2 noted, no murmurs/rubs/gallops noted LUNGS: Lungs are clear to auscultation bilaterally ABDOMEN: soft NEURO: Pt is awake/alert, moves all extremitiesx4 EXTREMITIES:full ROM SKIN: warm, color normal   ED Treatments / Results   DIAGNOSTIC STUDIES: Oxygen Saturation is 100% on RA, normal by my interpretation.    COORDINATION OF CARE:  11:33 PM Discussed treatment plan with pt at bedside which includes Penicillin and pt agreed to plan.    Labs (all labs ordered are listed, but only abnormal results are displayed) Labs Reviewed - No data to display  EKG  EKG Interpretation None       Radiology No results found.  Procedures Procedures  Medications Ordered in ED Medications  penicillin v potassium (VEETID) tablet 500 mg (500 mg Oral Given 05/02/16 2346)  HYDROcodone-acetaminophen (NORCO/VICODIN) 5-325  MG per tablet 1 tablet (1 tablet Oral Given 05/02/16 2346)     Initial Impression / Assessment and Plan / ED Course  I have reviewed the triage vital signs and the nursing notes.    Final Clinical Impressions(s) / ED Diagnoses   Final diagnoses:  Pain, dental    New Prescriptions Discharge Medication List as of 05/02/2016 11:38 PM    START taking these medications   Details  penicillin v potassium (VEETID) 500 MG tablet Take 1 tablet (500 mg total) by mouth 4 (four) times daily., Starting Thu 05/02/2016, Until Thu 05/09/2016, Print        I personally performed the services described in this documentation, which was scribed in my presence. The recorded information has been reviewed and is accurate.        Zadie Rhine, MD 05/03/16 [id number]

## 2016-05-02 NOTE — ED Notes (Signed)
Pt c/o right side lower toothache that started last night, worse today, has been taking ibuprofen without any relief,

## 2016-05-02 NOTE — ED Triage Notes (Signed)
Pt states she was seen a couple of months ago for dental pain.  Antibiotics made the pain go away and she did not follow up with a dentist.  Pain started again last night

## 2016-06-04 ENCOUNTER — Ambulatory Visit: Payer: Self-pay | Admitting: Physician Assistant

## 2016-06-23 ENCOUNTER — Encounter (HOSPITAL_COMMUNITY): Payer: Self-pay | Admitting: Emergency Medicine

## 2016-06-23 ENCOUNTER — Emergency Department (HOSPITAL_COMMUNITY)
Admission: EM | Admit: 2016-06-23 | Discharge: 2016-06-24 | Disposition: A | Discharge status: Home / Self Care | Payer: Self-pay | Attending: Emergency Medicine | Admitting: Emergency Medicine

## 2016-06-23 DIAGNOSIS — K0889 Other specified disorders of teeth and supporting structures: Secondary | ICD-10-CM

## 2016-06-23 DIAGNOSIS — K029 Dental caries, unspecified: Secondary | ICD-10-CM | POA: Insufficient documentation

## 2016-06-23 DIAGNOSIS — Z87891 Personal history of nicotine dependence: Secondary | ICD-10-CM | POA: Insufficient documentation

## 2016-06-23 MED ORDER — AMOXICILLIN 500 MG PO CAPS: 500.0000 mg | ORAL_CAPSULE | Freq: Three times a day (TID) | ORAL | 0 refills | Status: DC

## 2016-06-23 MED ORDER — ACETAMINOPHEN 500 MG PO TABS
1000.0000 mg | ORAL_TABLET | Freq: Once | ORAL | Status: AC
  Administered 2016-06-23: 1000 mg via ORAL
  Filled 2016-06-23: qty 2

## 2016-06-23 MED ORDER — PROMETHAZINE HCL 12.5 MG PO TABS
12.5000 mg | ORAL_TABLET | Freq: Once | ORAL | Status: AC
  Administered 2016-06-23: 12.5 mg via ORAL
  Filled 2016-06-23: qty 1

## 2016-06-23 MED ORDER — AMOXICILLIN 250 MG PO CAPS
500.0000 mg | ORAL_CAPSULE | Freq: Once | ORAL | Status: AC
  Administered 2016-06-23: 500 mg via ORAL
  Filled 2016-06-23: qty 2

## 2016-06-23 MED ORDER — IBUPROFEN 800 MG PO TABS
800.0000 mg | ORAL_TABLET | Freq: Once | ORAL | Status: AC
  Administered 2016-06-23: 800 mg via ORAL
  Filled 2016-06-23: qty 1

## 2016-06-23 NOTE — ED Triage Notes (Signed)
Pt c/o dental pain one week.

## 2016-06-23 NOTE — ED Provider Notes (Signed)
[facility] Provider Note   CSN: [id number] Arrival date & time: 06/23/16  2050     History   Chief Complaint Chief Complaint  Patient presents with  . Dental Pain    HPI Kelli Barker is a 26 y.o. female.  The history is provided by the patient.  Dental Pain   This is a chronic problem. Episode onset: worse for the past 2 days. The problem occurs constantly. The problem has been gradually worsening. The pain is moderate. She has tried acetaminophen (goody powders) for the symptoms. The treatment provided no relief.    Past Medical History:  Diagnosis Date  . Anxiety   . Depression     Patient Active Problem List   Diagnosis Date Noted  . Left knee pain 03/01/2015  . Anxiety state 03/01/2015  . Obesity, unspecified 03/01/2015  . Cigarette nicotine dependence, uncomplicated 03/01/2015    Past Surgical History:  Procedure Laterality Date  . CHOLECYSTECTOMY  08-2008    OB History    No data available       Home Medications    Prior to Admission medications   Medication Sig Start Date End Date Taking? Authorizing Provider  Aspirin-Salicylamide-Caffeine (BC HEADACHE) 325-95-16 MG TABS Take 1 Package by mouth once as needed (for dental pain).    [provider]  ibuprofen (ADVIL,MOTRIN) 200 MG tablet Take 800 mg by mouth daily as needed.     [provider]    Family History Family History  Problem Relation Age of Onset  . COPD Father   . Hypertension Maternal Grandfather     Social History Social History  Substance Use Topics  . Smoking status: Former Smoker    Years: 10.00    Types: Cigarettes    Quit date: 01/15/2015  . Smokeless tobacco: Never Used  . Alcohol use No     Allergies   Sulfa antibiotics   Review of Systems Review of Systems  Constitutional: Negative for activity change and appetite change.  HENT: Positive for dental problem. Negative for congestion, ear discharge, ear pain, facial swelling,  nosebleeds, rhinorrhea, sneezing and tinnitus.   Eyes: Negative for photophobia, pain and discharge.  Respiratory: Negative for cough, choking, shortness of breath and wheezing.   Cardiovascular: Negative for chest pain, palpitations and leg swelling.  Gastrointestinal: Negative for abdominal pain, blood in stool, constipation, diarrhea, nausea and vomiting.  Genitourinary: Negative for difficulty urinating, dysuria, flank pain, frequency and hematuria.  Musculoskeletal: Negative for back pain, gait problem, myalgias and neck pain.  Skin: Negative for color change, rash and wound.  Neurological: Negative for dizziness, seizures, syncope, facial asymmetry, speech difficulty, weakness and numbness.  Hematological: Negative for adenopathy. Does not bruise/bleed easily.  Psychiatric/Behavioral: Negative for agitation, confusion, hallucinations, self-injury and suicidal ideas. The patient is not nervous/anxious.      Physical Exam Updated Vital Signs BP 126/87   Pulse 75   Temp 98.9 F (37.2 C)   Resp 18   Ht 5\' 2"  (1.575 m)   Wt 77.1 kg (170 lb)   LMP 05/27/2016   SpO2 99%   BMI 31.09 kg/m   Physical Exam  Constitutional: Vital signs are normal. She appears well-developed and well-nourished. She is active.  HENT:  Head: Normocephalic and atraumatic.  Right Ear: Tympanic membrane, external ear and ear canal normal.  Left Ear: Tympanic membrane, external ear and ear canal normal.  Nose: Nose normal.  Mouth/Throat: Uvula is midline, oropharynx is clear and moist and mucous membranes are normal.  Right lower moller has a deep cavity. Mod swelling around the tooth. No visible abscess. Airway patent. No facial swelling  Eyes: Conjunctivae, EOM and lids are normal. Pupils are equal, round, and reactive to light.  Neck: Trachea normal, normal range of motion and phonation normal. Neck supple. Carotid bruit is not present.  Cardiovascular: Normal rate, regular rhythm and normal pulses.     Abdominal: Soft. Normal appearance and bowel sounds are normal.  Lymphadenopathy:       Head (right side): No submental, no preauricular and no posterior auricular adenopathy present.       Head (left side): No submental, no preauricular and no posterior auricular adenopathy present.    She has no cervical adenopathy.  Neurological: She is alert. She has normal strength. No cranial nerve deficit or sensory deficit. GCS eye subscore is 4. GCS verbal subscore is 5. GCS motor subscore is 6.  Skin: Skin is warm and dry.  Psychiatric: Her speech is normal.     ED Treatments / Results  Labs (all labs ordered are listed, but only abnormal results are displayed) Labs Reviewed - No data to display  EKG  EKG Interpretation None       Radiology No results found.  Procedures Procedures (including critical care time)  Medications Ordered in ED Medications  amoxicillin (AMOXIL) capsule 500 mg (not administered)  ibuprofen (ADVIL,MOTRIN) tablet 800 mg (not administered)  acetaminophen (TYLENOL) tablet 1,000 mg (not administered)  promethazine (PHENERGAN) tablet 12.5 mg (not administered)     Initial Impression / Assessment and Plan / ED Course  I have reviewed the triage vital signs and the nursing notes.  Pertinent labs & imaging results that were available during my care of the patient were reviewed by me and considered in my medical decision making (see chart for details).       Final Clinical Impressions(s) / ED Diagnoses MDM Pt has a deep cavity on the lower right. Airway patent. No evidence for Ludwig's angina. Pt treated with amoxil and ibuprofen and tylenol. Pt is on the waiting list for the free dental clinic.   Final diagnoses:  Pain, dental  Dental caries    New Prescriptions New Prescriptions   No medications on file     Duayne CalBryant, Gianina Olinde, PA-C 06/23/16 2346    Dione BoozeGlick, David, MD 06/24/16 0002

## 2016-06-23 NOTE — Discharge Instructions (Signed)
Please use 500mg  of amoxil three times daily. Use 600mg  of ibuprofen and 500mg  of tylenol every 6 hours. See your dentist as soon as possible.

## 2016-07-03 ENCOUNTER — Encounter: Payer: Self-pay | Admitting: Physician Assistant

## 2016-10-03 ENCOUNTER — Emergency Department (HOSPITAL_COMMUNITY)
Admission: EM | Admit: 2016-10-03 | Discharge: 2016-10-03 | Disposition: A | Discharge status: Home / Self Care | Payer: Self-pay | Attending: Emergency Medicine | Admitting: Emergency Medicine

## 2016-10-03 ENCOUNTER — Encounter (HOSPITAL_COMMUNITY): Payer: Self-pay | Admitting: *Deleted

## 2016-10-03 DIAGNOSIS — Z87891 Personal history of nicotine dependence: Secondary | ICD-10-CM | POA: Insufficient documentation

## 2016-10-03 DIAGNOSIS — Z9049 Acquired absence of other specified parts of digestive tract: Secondary | ICD-10-CM | POA: Insufficient documentation

## 2016-10-03 DIAGNOSIS — K0889 Other specified disorders of teeth and supporting structures: Secondary | ICD-10-CM | POA: Insufficient documentation

## 2016-10-03 DIAGNOSIS — F419 Anxiety disorder, unspecified: Secondary | ICD-10-CM | POA: Insufficient documentation

## 2016-10-03 DIAGNOSIS — F329 Major depressive disorder, single episode, unspecified: Secondary | ICD-10-CM | POA: Insufficient documentation

## 2016-10-03 MED ORDER — AMOXICILLIN 500 MG PO CAPS: 500.0000 mg | ORAL_CAPSULE | Freq: Three times a day (TID) | ORAL | 0 refills | Status: DC

## 2016-10-03 NOTE — ED Triage Notes (Signed)
Pt comes in with right lower dental pain starting Sunday. Pt had same occur 6 months ago.

## 2016-10-03 NOTE — Discharge Instructions (Signed)
Follow-up with a dentist soon.   °

## 2016-10-03 NOTE — ED Provider Notes (Signed)
[facility] Provider Note   CSN: [id number] Arrival date & time: 10/03/16  1528     History   Chief Complaint Chief Complaint  Patient presents with  . Dental Pain    HPI Kelli Barker is a 26 y.o. female.  HPI  Kelli Barker is a 26 y.o. female who presents to the Emergency Department complaining of recurrent dental pain. Complains of throbbing pain to the right lower molar.  She was seen here in June for same.  She has not been able to follow-up with a dentist yet.  Pain improves with OTC excedrin.  She states the pain is constant and worse with chewing.  She denies facial swelling, neck pain, difficulty swallowing or opening and closing her mouth.    Past Medical History:  Diagnosis Date  . Anxiety   . Depression     Patient Active Problem List   Diagnosis Date Noted  . Left knee pain 03/01/2015  . Anxiety state 03/01/2015  . Obesity, unspecified 03/01/2015  . Cigarette nicotine dependence, uncomplicated 03/01/2015    Past Surgical History:  Procedure Laterality Date  . CHOLECYSTECTOMY  08-2008    OB History    No data available       Home Medications    Prior to Admission medications   Medication Sig Start Date End Date Taking? Authorizing Provider  amoxicillin (AMOXIL) 500 MG capsule Take 1 capsule (500 mg total) by mouth 3 (three) times daily. 06/23/16   Ivery Quale, PA-C  Aspirin-Salicylamide-Caffeine (BC HEADACHE) [phone] MG TABS Take 1 Package by mouth once as needed (for dental pain).    [provider]  ibuprofen (ADVIL,MOTRIN) 200 MG tablet Take 800 mg by mouth daily as needed.     [provider]    Family History Family History  Problem Relation Age of Onset  . COPD Father   . Hypertension Maternal Grandfather     Social History Social History  Substance Use Topics  . Smoking status: Former Smoker    Years: 10.00    Types: Cigarettes    Quit date: 01/15/2015  . Smokeless tobacco: Never Used  .  Alcohol use No     Allergies   Sulfa antibiotics   Review of Systems Review of Systems  Constitutional: Negative for appetite change and fever.  HENT: Positive for dental problem. Negative for congestion, facial swelling, sore throat and trouble swallowing.   Eyes: Negative for pain and visual disturbance.  Musculoskeletal: Negative for neck pain and neck stiffness.  Neurological: Negative for dizziness, facial asymmetry and headaches.  Hematological: Negative for adenopathy.  All other systems reviewed and are negative.    Physical Exam Updated Vital Signs BP (!) 98/52 Comment: taken twice for validation. Pt not symptomatic. Other VS normal.  Pulse 67   Temp 98.3 F (36.8 C) (Oral)   Resp 18   Ht  (1.6 m)   Wt 81.6 kg (180 lb)   LMP 09/14/2016   SpO2 100%   BMI 31.89 kg/m   Physical Exam  Constitutional: She is oriented to person, place, and time. She appears well-developed and well-nourished. No distress.  HENT:  Head: Normocephalic and atraumatic.  Right Ear: Tympanic membrane and ear canal normal.  Left Ear: Tympanic membrane and ear canal normal.  Mouth/Throat: Uvula is midline, oropharynx is clear and moist and mucous membranes are normal. No trismus in the jaw. Dental caries present. No dental abscesses or uvula swelling.  Tenderness and dental caries of the right lower first  molar. Mild edema of the surrounding gingiva w/o abscess.  No facial swelling, no trismus, or sublingual abnml.    Neck: Normal range of motion. Neck supple.  Cardiovascular: Normal rate, regular rhythm and normal heart sounds.   No murmur heard. Pulmonary/Chest: Effort normal and breath sounds normal.  Musculoskeletal: Normal range of motion.  Lymphadenopathy:    She has no cervical adenopathy.  Neurological: She is alert and oriented to person, place, and time. She exhibits normal muscle tone. Coordination normal.  Skin: Skin is warm and dry.  Nursing note and vitals  reviewed.    ED Treatments / Results  Labs (all labs ordered are listed, but only abnormal results are displayed) Labs Reviewed - No data to display  EKG  EKG Interpretation None       Radiology No results found.  Procedures Procedures (including critical care time)  Medications Ordered in ED Medications - No data to display   Initial Impression / Assessment and Plan / ED Course  I have reviewed the triage vital signs and the nursing notes.  Pertinent labs & imaging results that were available during my care of the patient were reviewed by me and considered in my medical decision making (see chart for details).     Pt non-toxic.  Airway patent.  No concerning sx's for dental abscess or Ludwig's angina  Final Clinical Impressions(s) / ED Diagnoses   Final diagnoses:  Pain, dental    New Prescriptions New Prescriptions   No medications on file     Pauline Aus, Cordelia Poche 10/03/16 1807    LongArlyss Repress, MD 10/04/16 1126

## 2016-10-04 ENCOUNTER — Encounter (HOSPITAL_COMMUNITY): Payer: Self-pay | Admitting: Emergency Medicine

## 2016-10-04 ENCOUNTER — Emergency Department (HOSPITAL_COMMUNITY): Payer: Self-pay

## 2016-10-04 DIAGNOSIS — Y929 Unspecified place or not applicable: Secondary | ICD-10-CM | POA: Insufficient documentation

## 2016-10-04 DIAGNOSIS — W230XXA Caught, crushed, jammed, or pinched between moving objects, initial encounter: Secondary | ICD-10-CM | POA: Insufficient documentation

## 2016-10-04 DIAGNOSIS — Z87891 Personal history of nicotine dependence: Secondary | ICD-10-CM | POA: Insufficient documentation

## 2016-10-04 DIAGNOSIS — S9032XA Contusion of left foot, initial encounter: Secondary | ICD-10-CM | POA: Insufficient documentation

## 2016-10-04 DIAGNOSIS — Y999 Unspecified external cause status: Secondary | ICD-10-CM | POA: Insufficient documentation

## 2016-10-04 DIAGNOSIS — Y9389 Activity, other specified: Secondary | ICD-10-CM | POA: Insufficient documentation

## 2016-10-04 NOTE — ED Triage Notes (Signed)
Dropped a TV on her foot

## 2016-10-05 ENCOUNTER — Emergency Department (HOSPITAL_COMMUNITY)
Admission: EM | Admit: 2016-10-05 | Discharge: 2016-10-05 | Disposition: A | Discharge status: Home / Self Care | Payer: Self-pay | Attending: Emergency Medicine | Admitting: Emergency Medicine

## 2016-10-05 DIAGNOSIS — S9032XA Contusion of left foot, initial encounter: Secondary | ICD-10-CM

## 2016-10-05 NOTE — Discharge Instructions (Signed)
Elevate and apply ice packs on and off to your foot. Use the crutches as needed for weightbearing. Ibuprofen for pain. Follow-up with your primary provider or call Dr. Mort Sawyers office to arrange a follow-up appointment in one week if not improving.

## 2016-10-05 NOTE — ED Provider Notes (Signed)
[facility]-EMERG[facility]Y DEPT Provider Note   CSN: [id number] Arrival date & time: 10/04/16  2303     History   Chief Complaint Chief Complaint  Patient presents with  . Foot Pain    HPI Kelli Barker is a 26 y.o. female.  HPI   Kelli Barker is a 26 y.o. female who presents to the Emergency Department complaining of sudden onset of pain and swelling to the dorsal left foot. She states that she accidentally dropped a  50 pound television on her foot shortly before arrival to the ER. She reports immediate swelling and mild bruising to her foot. Pain is worse with movement and with weightbearing. She has not tried any therapies or medications prior to arrival. She denies open wounds, numbness of her foot or toes and ankle pain.  Past Medical History:  Diagnosis Date  . Anxiety   . Depression     Patient Active Problem List   Diagnosis Date Noted  . Left knee pain 03/01/2015  . Anxiety state 03/01/2015  . Obesity, unspecified 03/01/2015  . Cigarette nicotine dependence, uncomplicated 03/01/2015    Past Surgical History:  Procedure Laterality Date  . CHOLECYSTECTOMY  08-2008    OB History    No data available       Home Medications    Prior to Admission medications   Medication Sig Start Date End Date Taking? Authorizing Provider  amoxicillin (AMOXIL) 500 MG capsule Take 1 capsule (500 mg total) by mouth 3 (three) times daily. 10/03/16   Wilba Mutz, PA-C  Aspirin-Salicylamide-Caffeine (BC HEADACHE) 325-95-16 MG TABS Take 1 Package by mouth once as needed (for dental pain).    [provider]  ibuprofen (ADVIL,MOTRIN) 200 MG tablet Take 800 mg by mouth daily as needed.     [provider]    Family History Family History  Problem Relation Age of Onset  . COPD Father   . Hypertension Maternal Grandfather     Social History Social History  Substance Use Topics  . Smoking status: Former Smoker    Years: 10.00    Types: Cigarettes    Quit  date: 01/15/2015  . Smokeless tobacco: Never Used  . Alcohol use No     Allergies   Sulfa antibiotics   Review of Systems Review of Systems  Constitutional: Negative for chills and fever.  Genitourinary: Negative for difficulty urinating and dysuria.  Musculoskeletal: Positive for arthralgias (eft foot pain) and joint swelling.  Skin: Positive for color change (bruising left foot). Negative for wound.  Neurological: Negative for weakness and numbness.  All other systems reviewed and are negative.    Physical Exam Updated Vital Signs BP (!) 107/42   Pulse 80   Temp 98 F (36.7 C) (Oral)   Resp 16   Ht  (1.6 m)   Wt 81.6 kg (180 lb)   LMP 09/14/2016   SpO2 98%   BMI 31.89 kg/m   Physical Exam  Constitutional: She is oriented to person, place, and time. She appears well-developed and well-nourished. No distress.  HENT:  Head: Normocephalic and atraumatic.  Cardiovascular: Normal rate, regular rhythm and intact distal pulses.   Pulmonary/Chest: Effort normal and breath sounds normal.  Musculoskeletal: She exhibits edema and tenderness. She exhibits no deformity.  Small hematoma to the dorsal aspect of the left foot. Mild ecchymosis present. No bony deformity. No proximal tenderness.  Neurological: She is alert and oriented to person, place, and time. No sensory deficit. She exhibits normal muscle tone. Coordination  normal.  Skin: Skin is warm and dry. Capillary refill takes less than 2 seconds.  Nursing note and vitals reviewed.    ED Treatments / Results  Labs (all labs ordered are listed, but only abnormal results are displayed) Labs Reviewed - No data to display  EKG  EKG Interpretation None       Radiology Dg Foot Complete Left  Result Date: 10/04/2016 CLINICAL DATA:  Pain and swelling after dropping television on foot tonight. EXAM: LEFT FOOT - COMPLETE 3+ VIEW COMPARISON:  None. FINDINGS: There is no evidence of fracture or dislocation. There is no  evidence of arthropathy or other focal bone abnormality. Small plantar calcaneal spur. Dorsal mid and forefoot soft tissue swelling without subcutaneous gas or radiopaque foreign bodies. IMPRESSION: Soft tissue swelling.  No acute osseous process. Electronically Signed   By: Awilda Metro M.D.   On: 10/04/2016 23:52    Procedures Procedures (including critical care time)  Medications Ordered in ED Medications - No data to display   Initial Impression / Assessment and Plan / ED Course  I have reviewed the triage vital signs and the nursing notes.  Pertinent labs & imaging results that were available during my care of the patient were reviewed by me and considered in my medical decision making (see chart for details).     X-rays negative for fracture, remains NV intact.  Patient agrees to elevate, ice, ibuprofen for pain. Patient given crutches for weightbearing. She agrees to orthopedic follow-up in one week if not improving.  Final Clinical Impressions(s) / ED Diagnoses   Final diagnoses:  Contusion of left foot, initial encounter    New Prescriptions New Prescriptions   No medications on file     Rosey Bath 10/05/16 0104    Dione Booze, MD 10/05/16 [phone]

## 2016-11-29 ENCOUNTER — Encounter (HOSPITAL_COMMUNITY): Payer: Self-pay

## 2016-11-29 ENCOUNTER — Emergency Department (HOSPITAL_COMMUNITY)
Admission: EM | Admit: 2016-11-29 | Discharge: 2016-11-29 | Disposition: A | Discharge status: Home / Self Care | Payer: Self-pay | Attending: Emergency Medicine | Admitting: Emergency Medicine

## 2016-11-29 DIAGNOSIS — B9620 Unspecified Escherichia coli [E. coli] as the cause of diseases classified elsewhere: Secondary | ICD-10-CM | POA: Insufficient documentation

## 2016-11-29 DIAGNOSIS — Z87891 Personal history of nicotine dependence: Secondary | ICD-10-CM | POA: Insufficient documentation

## 2016-11-29 DIAGNOSIS — N390 Urinary tract infection, site not specified: Secondary | ICD-10-CM | POA: Insufficient documentation

## 2016-11-29 DIAGNOSIS — J069 Acute upper respiratory infection, unspecified: Secondary | ICD-10-CM | POA: Insufficient documentation

## 2016-11-29 LAB — URINALYSIS, ROUTINE W REFLEX MICROSCOPIC
BILIRUBIN URINE: NEGATIVE
Bacteria, UA: NONE SEEN
GLUCOSE, UA: NEGATIVE mg/dL
KETONES UR: NEGATIVE mg/dL
NITRITE: NEGATIVE
PH: 6 (ref 5.0–8.0)
Protein, ur: NEGATIVE mg/dL
SPECIFIC GRAVITY, URINE: 1.008 (ref 1.005–1.030)

## 2016-11-29 LAB — PREGNANCY, URINE: Preg Test, Ur: NEGATIVE

## 2016-11-29 MED ORDER — ALBUTEROL SULFATE HFA 108 (90 BASE) MCG/ACT IN AERS: 2.0000 | INHALATION_SPRAY | RESPIRATORY_TRACT | 3 refills | Status: DC | PRN

## 2016-11-29 MED ORDER — CEPHALEXIN 500 MG PO CAPS: 500.0000 mg | ORAL_CAPSULE | Freq: Two times a day (BID) | ORAL | 0 refills | Status: DC

## 2016-11-29 MED ORDER — CEPHALEXIN 500 MG PO CAPS: 500.0000 mg | ORAL_CAPSULE | Freq: Two times a day (BID) | ORAL | 0 refills | Status: AC

## 2016-11-29 MED ORDER — BENZONATATE 100 MG PO CAPS: 100.0000 mg | ORAL_CAPSULE | Freq: Three times a day (TID) | ORAL | 0 refills | Status: DC

## 2016-11-29 NOTE — ED Notes (Signed)
Advised patient we needed urine specimen. 

## 2016-11-29 NOTE — ED Triage Notes (Signed)
Pt reports cough, sneezing, chills, and diarrhea since last night.

## 2016-11-29 NOTE — ED Notes (Signed)
Flu like sx x several days   Here for eval

## 2016-11-29 NOTE — ED Provider Notes (Signed)
[facility]ENCY [facility]PARTMENT Provider Note   CSN: [id number] Arrival date & time: 11/29/16  1746     History   Chief Complaint Chief Complaint  Patient presents with  . Fever    HPI Kelli Barker is a 26 y.o. female.  HPI  The patient is a 26 year old female, she has no significant past medical history and takes no daily medications according to her report she presents to the hospital with less than 24 hours of coughing, sneezing, body aches and subjective fevers and chills.  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  She has not taken any medications prior to arrival.  She feels like she is wheezing and has tightness in her chest when she breathes.  There is been no swelling in her legs, persistent coughing, persistent sneezing, the cough is dry and nonproductive.  Past Medical History:  Diagnosis Date  . Anxiety   . Depression     Patient Active Problem List   Diagnosis Date Noted  . Left knee pain 03/01/2015  . Anxiety state 03/01/2015  . Obesity, unspecified 03/01/2015  . Cigarette nicotine dependence, uncomplicated 03/01/2015    Past Surgical History:  Procedure Laterality Date  . CHOLECYSTECTOMY  08-2008    OB History    No data available       Home Medications    Prior to Admission medications   Medication Sig Start Date End Date Taking? Authorizing Provider  albuterol (PROVENTIL HFA;VENTOLIN HFA) 108 (90 Base) MCG/ACT inhaler Inhale 2 puffs every 4 (four) hours as needed into the lungs for wheezing or shortness of breath. 11/29/16   Eber HongMiller, Laysa Kimmey, MD  benzonatate (TESSALON) 100 MG capsule Take 1 capsule (100 mg total) every 8 (eight) hours by mouth. 11/29/16   Eber HongMiller, Edris Schneck, MD  cephALEXin (KEFLEX) 500 MG capsule Take 1 capsule (500 mg total) 2 (two) times daily for 5 days by mouth. 11/29/16 12/04/16  Eber HongMiller, Masiah Woody, MD    Family History Family History  Problem Relation Age of Onset  . COPD Father   .  Hypertension Maternal Grandfather     Social History Social History   Tobacco Use  . Smoking status: Former Smoker    Years: 10.00    Types: Cigarettes    Last attempt to quit: 01/15/2015    Years since quitting: 1.8  . Smokeless tobacco: Never Used  Substance Use Topics  . Alcohol use: No  . Drug use: No     Allergies   Sulfa antibiotics   Review of Systems Review of Systems  Constitutional: Positive for chills and fever.  HENT: Positive for congestion.   Respiratory: Positive for cough, shortness of breath and wheezing.   Gastrointestinal: Negative for diarrhea, nausea and vomiting.  Genitourinary: Positive for dysuria.     Physical Exam Updated Vital Signs BP 108/62   Pulse 94   Temp 99.1 F (37.3 C) (Oral)   Resp 18   Ht 5\' 2"  (1.575 m)   Wt 81.6 kg (180 lb)   LMP 11/25/2016   SpO2 100%   BMI 32.92 kg/m   Physical Exam  Constitutional: She appears well-developed and well-nourished. No distress.  HENT:  Head: Normocephalic and atraumatic.  Mouth/Throat: Oropharynx is clear and moist. No oropharyngeal exudate.  Eyes: Conjunctivae and EOM are normal. Pupils are equal, round, and reactive to light. Right eye exhibits no discharge. Left eye exhibits no discharge. No scleral icterus.  Neck: Normal range of motion. Neck supple. No JVD present.  No thyromegaly present.  Cardiovascular: Normal rate, regular rhythm, normal heart sounds and intact distal pulses. Exam reveals no gallop and no friction rub.  No murmur heard. Pulmonary/Chest: Effort normal and breath sounds normal. No respiratory distress. She has no wheezes. She has no rales.  The lung sounds are very clear without wheezing rhonchi or rales, the patient is able to speak in full sentences, she has no increased work of breathing, no accessory muscle use, she does cough frequently with deep breathing  Abdominal: Soft. Bowel sounds are normal. She exhibits no distension and no mass. There is no tenderness.    There is no abdominal tenderness to palpation  Musculoskeletal: Normal range of motion. She exhibits no edema or tenderness.  Lymphadenopathy:    She has no cervical adenopathy.  Neurological: She is alert. Coordination normal.  Skin: Skin is warm and dry. No rash noted. No erythema.  Psychiatric: She has a normal mood and affect. Her behavior is normal.  Nursing note and vitals reviewed.    ED Treatments / Results  Labs (all labs ordered are listed, but only abnormal results are displayed) Labs Reviewed  URINALYSIS, ROUTINE W REFLEX MICROSCOPIC - Abnormal; Notable for the following components:      Result Value   Color, Urine STRAW (*)    APPearance HAZY (*)    Hgb urine dipstick MODERATE (*)    Leukocytes, UA LARGE (*)    Squamous Epithelial / LPF 0-5 (*)    All other components within normal limits  PREGNANCY, URINE     Radiology No results found.  Procedures Procedures (including critical care time)  Medications Ordered in ED Medications - No data to display   Initial Impression / Assessment and Plan / ED Course  I have reviewed the triage vital signs and the nursing notes.  Pertinent labs & imaging results that were available during my care of the patient were reviewed by me and considered in my medical decision making (see chart for details).     The patient is coughing, wheezing, sneezing and nasal congestion, this could be all consistent with a viral process would even consider the flu however the patient is otherwise well-appearing.  She did not get a flu shot this year.  I do not think that she needs the flu medicine at this time as she does not have an objective fever, I cautioned her on the side effects of that medication and she is agreeable to using cough suppressants and anti-inflammatories.  We will check a urinalysis to rule out a urinary tract infection with her recent dysuria for the last week.  The patient has clear lung sounds, she will be given  Tessalon and albuterol.  Urinalysis reveals lots of white blood cells, no signs of bacteria but the patient denies any risk or symptoms of sexually transmitted diseases.  Given her dysuria for the week we will treat for urinary tract infection.  Culture ordered.  Patient agreeable to being discharged on medications to treat viral process and a bacterial urinary tract infection  Final Clinical Impressions(s) / ED Diagnoses   Final diagnoses:  Upper respiratory tract infection, unspecified type  Urinary tract infection without hematuria, site unspecified    ED Discharge Orders        Ordered    benzonatate (TESSALON) 100 MG capsule  Every 8 hours     11/29/16 2032    cephALEXin (KEFLEX) 500 MG capsule  2 times daily     11/29/16 2032  albuterol (PROVENTIL HFA;VENTOLIN HFA) 108 (90 Base) MCG/ACT inhaler  Every 4 hours PRN     11/29/16 2032       Eber Hong, MD 11/29/16 2033

## 2016-11-29 NOTE — Discharge Instructions (Signed)

## 2016-12-02 LAB — URINE CULTURE: Culture: >=100000 — AB

## 2016-12-03 ENCOUNTER — Telehealth: Payer: Self-pay | Admitting: Emergency Medicine

## 2016-12-03 NOTE — Telephone Encounter (Signed)
Post ED Visit - Positive Culture Follow-up  Culture report reviewed by antimicrobial stewardship pharmacist:  []  Enzo BiNathan Batchelder, Pharm.D. []  Celedonio MiyamotoJeremy Frens, Pharm.D., BCPS AQ-ID []  Garvin FilaMike Maccia, Pharm.D., BCPS []  Georgina PillionElizabeth Martin, [address] BCPS []  Little ValleyMinh Pham, [address]Pharm[address]D., BCPS, AAHIVP []  Estella HuskMichelle Turner, Pharm.D., BCPS, AAHIVP []  Lysle Pearlachel Rumbarger, PharmD, BCPS []  Casilda Carlsaylor Stone, PharmD, BCPS []  Pollyann SamplesAndy Johnston, PharmD, BCPS  Positive urine culture Treated with cephalexin, organism sensitive to the same and no further patient follow-up is required at this time.  Berle MullMiller, Kagan Mutchler 12/03/2016, 2:05 PM

## 2017-03-06 ENCOUNTER — Emergency Department (HOSPITAL_COMMUNITY)
Admission: EM | Admit: 2017-03-06 | Discharge: 2017-03-06 | Disposition: A | Discharge status: Home / Self Care | Payer: Self-pay | Attending: Emergency Medicine | Admitting: Emergency Medicine

## 2017-03-06 ENCOUNTER — Encounter (HOSPITAL_COMMUNITY): Payer: Self-pay | Admitting: Emergency Medicine

## 2017-03-06 DIAGNOSIS — Z87891 Personal history of nicotine dependence: Secondary | ICD-10-CM | POA: Insufficient documentation

## 2017-03-06 DIAGNOSIS — B009 Herpesviral infection, unspecified: Secondary | ICD-10-CM | POA: Insufficient documentation

## 2017-03-06 DIAGNOSIS — N949 Unspecified condition associated with female genital organs and menstrual cycle: Secondary | ICD-10-CM

## 2017-03-06 LAB — URINALYSIS, ROUTINE W REFLEX MICROSCOPIC
BACTERIA UA: NONE SEEN
BILIRUBIN URINE: NEGATIVE
Glucose, UA: NEGATIVE mg/dL
Ketones, ur: NEGATIVE mg/dL
Nitrite: NEGATIVE
PROTEIN: 100 mg/dL — AB
SPECIFIC GRAVITY, URINE: 1.018 (ref 1.005–1.030)
Squamous Epithelial / LPF: NONE SEEN
pH: 7 (ref 5.0–8.0)

## 2017-03-06 LAB — POC URINE PREG, ED: Preg Test, Ur: NEGATIVE

## 2017-03-06 MED ORDER — ONDANSETRON 8 MG PO TBDP
8.0000 mg | ORAL_TABLET | Freq: Once | ORAL | Status: AC
Start: 2017-03-06 — End: 2017-03-06
  Administered 2017-03-06: 8 mg via ORAL

## 2017-03-06 MED ORDER — LIDOCAINE HCL (PF) 1 % IJ SOLN
INTRAMUSCULAR | Status: AC
  Administered 2017-03-06: 0.9 mL
  Filled 2017-03-06: qty 2

## 2017-03-06 MED ORDER — IBUPROFEN 600 MG PO TABS: 600.0000 mg | ORAL_TABLET | Freq: Four times a day (QID) | ORAL | 0 refills | Status: DC | PRN

## 2017-03-06 MED ORDER — AZITHROMYCIN 250 MG PO TABS
1000.0000 mg | ORAL_TABLET | Freq: Once | ORAL | Status: AC
  Administered 2017-03-06: 1000 mg via ORAL
  Filled 2017-03-06: qty 4

## 2017-03-06 MED ORDER — ONDANSETRON 8 MG PO TBDP
ORAL_TABLET | ORAL | Status: AC
  Filled 2017-03-06: qty 1

## 2017-03-06 MED ORDER — HYDROCODONE-ACETAMINOPHEN 5-325 MG PO TABS: ORAL_TABLET | ORAL | 0 refills | Status: DC

## 2017-03-06 MED ORDER — CEFTRIAXONE SODIUM 250 MG IJ SOLR
250.0000 mg | Freq: Once | INTRAMUSCULAR | Status: AC
  Administered 2017-03-06: 250 mg via INTRAMUSCULAR
  Filled 2017-03-06: qty 250

## 2017-03-06 MED ORDER — ACYCLOVIR 400 MG PO TABS: 400.0000 mg | ORAL_TABLET | Freq: Three times a day (TID) | ORAL | 0 refills | Status: DC
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
## 2017-03-06 NOTE — Discharge Instructions (Signed)
Someone from the hospital will notify you of your test results are positive.  Avoid intercourse until your results are back.  Follow-up with the health department

## 2017-03-06 NOTE — ED Triage Notes (Signed)
Patient complaining of "blisters" on right hand and buttocks x 1 week. States they are painful. Also complaining of vomiting last night.

## 2017-03-08 LAB — HIV ANTIBODY (ROUTINE TESTING W REFLEX): HIV Screen 4th Generation wRfx: NONREACTIVE

## 2017-03-08 LAB — RPR: RPR Ser Ql: NONREACTIVE

## 2017-03-10 LAB — GC/CHLAMYDIA PROBE AMP (~~LOC~~) NOT AT ARMC
CHLAMYDIA, DNA PROBE: NEGATIVE
Neisseria Gonorrhea: NEGATIVE

## 2017-03-10 LAB — HSV CULTURE AND TYPING

## 2017-03-31 ENCOUNTER — Encounter (HOSPITAL_COMMUNITY): Payer: Self-pay

## 2017-03-31 ENCOUNTER — Emergency Department (HOSPITAL_COMMUNITY)
Admission: EM | Admit: 2017-03-31 | Discharge: 2017-03-31 | Disposition: A | Discharge status: Home / Self Care | Payer: Self-pay | Attending: Emergency Medicine | Admitting: Emergency Medicine

## 2017-03-31 ENCOUNTER — Other Ambulatory Visit: Payer: Self-pay

## 2017-03-31 DIAGNOSIS — Z87891 Personal history of nicotine dependence: Secondary | ICD-10-CM | POA: Insufficient documentation

## 2017-03-31 DIAGNOSIS — B9789 Other viral agents as the cause of diseases classified elsewhere: Secondary | ICD-10-CM | POA: Insufficient documentation

## 2017-03-31 DIAGNOSIS — J069 Acute upper respiratory infection, unspecified: Secondary | ICD-10-CM | POA: Insufficient documentation

## 2017-03-31 DIAGNOSIS — J9801 Acute bronchospasm: Secondary | ICD-10-CM | POA: Insufficient documentation

## 2017-03-31 MED ORDER — ALBUTEROL SULFATE HFA 108 (90 BASE) MCG/ACT IN AERS
1.0000 | INHALATION_SPRAY | RESPIRATORY_TRACT | Status: DC | PRN
  Filled 2017-03-31: qty 6.7

## 2017-03-31 MED ORDER — VALACYCLOVIR HCL 500 MG PO TABS: 500.0000 mg | ORAL_TABLET | Freq: Two times a day (BID) | ORAL | 0 refills | Status: DC

## 2017-03-31 MED ORDER — IPRATROPIUM-ALBUTEROL 0.5-2.5 (3) MG/3ML IN SOLN
3.0000 mL | Freq: Once | RESPIRATORY_TRACT | Status: AC
  Administered 2017-03-31: 3 mL via RESPIRATORY_TRACT
  Filled 2017-03-31: qty 3

## 2017-03-31 NOTE — ED Provider Notes (Signed)
[facility] EMERGENCY DEPARTMENT Provider Note   CSN: [id number] Arrival date & time: 03/31/17  1125     History   Chief Complaint Chief Complaint  Patient presents with  . Cough    HPI Kelli Barker is a 27 y.o. female who presents with cough and congestion.  She states that she has had a progressively worsening cough since last Wednesday.  She initially thought it was allergies she states she has really bad allergies and has been sneezing and had a runny nose.  Then she started coughing up green sputum.  She has had a fever but not had one in 48 hours.  She also reports feeling like there is fluid behind her ears and some mild sinus pressure.  She has not taken anything for her symptoms.  She feels short of breath at times and feels like her chest is tight.  She has had recurrent bronchitis and has had albuterol for her symptoms in the past.  Additionally she is requesting a refill for Valtrex because she states she is having a herpes outbreak at this time.  HPI  Past Medical History:  Diagnosis Date  . Anxiety   . Depression     Patient Active Problem List   Diagnosis Date Noted  . Left knee pain 03/01/2015  . Anxiety state 03/01/2015  . Obesity, unspecified 03/01/2015  . Cigarette nicotine dependence, uncomplicated 03/01/2015    Past Surgical History:  Procedure Laterality Date  . CHOLECYSTECTOMY  08-2008    OB History    No data available       Home Medications    Prior to Admission medications   Medication Sig Start Date End Date Taking? Authorizing Provider  acyclovir (ZOVIRAX) 400 MG tablet Take 1 tablet (400 mg total) by mouth 3 (three) times daily. For 10 days 03/06/17   Triplett, Tammy, PA-C  albuterol (PROVENTIL HFA;VENTOLIN HFA) 108 (90 Base) MCG/ACT inhaler Inhale 2 puffs every 4 (four) hours as needed into the lungs for wheezing or shortness of breath. 11/29/16   Eber Hong, MD  benzonatate (TESSALON) 100 MG capsule Take 1 capsule (100 mg  total) every 8 (eight) hours by mouth. 11/29/16   Eber Hong, MD  HYDROcodone-acetaminophen (NORCO/VICODIN) 5-325 MG tablet Take one tab po q 4 hrs prn pain 03/06/17   Triplett, Tammy, PA-C  ibuprofen (ADVIL,MOTRIN) 600 MG tablet Take 1 tablet (600 mg total) by mouth every 6 (six) hours as needed. 03/06/17   Pauline Aus, PA-C    Family History Family History  Problem Relation Age of Onset  . COPD Father   . Hypertension Maternal Grandfather     Social History Social History   Tobacco Use  . Smoking status: Former Smoker    Years: 10.00    Types: Cigarettes    Last attempt to quit: 01/15/2015    Years since quitting: 2.2  . Smokeless tobacco: Never Used  Substance Use Topics  . Alcohol use: Yes    Comment: occasionally  . Drug use: No     Allergies   Sulfa antibiotics   Review of Systems Review of Systems  Constitutional: Positive for fever (Resolved).  HENT: Positive for congestion and sinus pressure. Negative for ear pain and sore throat.   Respiratory: Positive for cough, chest tightness and shortness of breath. Negative for wheezing.   Cardiovascular: Negative for chest pain.     Physical Exam Updated Vital Signs BP 121/66 (BP Location: Right Arm)   Pulse 99   Temp 99 F (  37.2 C) (Oral)   Resp 16   Ht 5\' 2"  (1.575 m)   Wt 90.7 kg (200 lb)   LMP 03/31/2017   SpO2 98%   BMI 36.58 kg/m   Physical Exam  Constitutional: She is oriented to person, place, and time. She appears well-developed and well-nourished. No distress.  Calm, cooperative.  Frequent episodes of dry coughing.  HENT:  Head: Normocephalic and atraumatic.  Right Ear: Hearing, tympanic membrane, external ear and ear canal normal.  Left Ear: Hearing, tympanic membrane, external ear and ear canal normal.  Nose: Mucosal edema present.  Mouth/Throat: Uvula is midline, oropharynx is clear and moist and mucous membranes are normal.  Eyes: Conjunctivae are normal. Pupils are equal, round, and  reactive to light. Right eye exhibits no discharge. Left eye exhibits no discharge. No scleral icterus.  Neck: Normal range of motion.  Cardiovascular: Tachycardia present. Exam reveals no gallop and no friction rub.  No murmur heard. Pulmonary/Chest: Effort normal and breath sounds normal. No stridor. No respiratory distress. She has no wheezes. She has no rales. She exhibits no tenderness.  Abdominal: She exhibits no distension.  Neurological: She is alert and oriented to person, place, and time.  Skin: Skin is warm and dry.  Psychiatric: She has a normal mood and affect. Her behavior is normal.  Nursing note and vitals reviewed.    ED Treatments / Results  Labs (all labs ordered are listed, but only abnormal results are displayed) Labs Reviewed - No data to display  EKG  EKG Interpretation None       Radiology No results found.  Procedures Procedures (including critical care time)  Medications Ordered in ED Medications  albuterol (PROVENTIL HFA;VENTOLIN HFA) 108 (90 Base) MCG/ACT inhaler 1-2 puff (not administered)  ipratropium-albuterol (DUONEB) 0.5-2.5 (3) MG/3ML nebulizer solution 3 mL (3 mLs Nebulization Given 03/31/17 1432)     Initial Impression / Assessment and Plan / ED Course  I have reviewed the triage vital signs and the nursing notes.  Pertinent labs & imaging results that were available during my care of the patient were reviewed by me and considered in my medical decision making (see chart for details).  27 year old female presents with symptoms consistent with bronchitis.  Vital signs are normal.  She is mildly ill-appearing.  Has frequent episodes of dry coughing.  She was offered a breathing treatment and plus or minus chest x-ray.  She would only like to do the breathing treatment at this time.  She states she has had bronchitis in the past.  After breathing treatment she feels improved.  Will discharge with an inhaler and advised supportive care.  She  was also given a prescription for Valtrex.  She is advised to return if worsening.  Final Clinical Impressions(s) / ED Diagnoses   Final diagnoses:  Viral URI with cough  Bronchospasm    ED Discharge Orders    None       Bethel BornGekas, Camden Knotek Marie, PA-C 03/31/17 1617    Loren RacerYelverton, David, MD 04/02/17 [phone]

## 2017-03-31 NOTE — Discharge Instructions (Signed)
Continue over the counter cough/cold medicine Use inhaler as needed for shortness of breath or wheezing Take Valtrex twice a day for 3 days  Follow up with your doctor Return if worsening

## 2017-03-31 NOTE — ED Triage Notes (Signed)
Pt has had a progressive cough. Now states she is coughing up green sputum. Pt is in NAD. Also has a herpes outbreak. Has small bumps to right hand. Bumps are not open at this time.

## 2017-04-04 ENCOUNTER — Other Ambulatory Visit: Payer: Self-pay

## 2017-04-04 ENCOUNTER — Emergency Department (HOSPITAL_COMMUNITY)
Admission: EM | Admit: 2017-04-04 | Discharge: 2017-04-04 | Disposition: A | Discharge status: Home / Self Care | Payer: Self-pay | Attending: Emergency Medicine | Admitting: Emergency Medicine

## 2017-04-04 ENCOUNTER — Encounter (HOSPITAL_COMMUNITY): Payer: Self-pay | Admitting: Emergency Medicine

## 2017-04-04 DIAGNOSIS — Z760 Encounter for issue of repeat prescription: Secondary | ICD-10-CM | POA: Insufficient documentation

## 2017-04-04 DIAGNOSIS — Z87891 Personal history of nicotine dependence: Secondary | ICD-10-CM | POA: Insufficient documentation

## 2017-04-04 DIAGNOSIS — N898 Other specified noninflammatory disorders of vagina: Secondary | ICD-10-CM | POA: Insufficient documentation

## 2017-04-04 HISTORY — DX: Herpesviral infection of urogenital system, unspecified: A60.00

## 2017-04-04 MED ORDER — ACYCLOVIR 400 MG PO TABS: 400.0000 mg | ORAL_TABLET | Freq: Three times a day (TID) | ORAL | 0 refills | Status: DC

## 2017-04-04 NOTE — ED Provider Notes (Signed)
[facility] EMERGENCY DEPARTMENT Provider Note   CSN: [id number] Arrival date & time: 04/04/17  1833     History   Chief Complaint Chief Complaint  Patient presents with  . SEXUALLY TRANSMITTED DISEASE    HPI [name] is a 27 y.o. female.  HPI  [name] is a 27 y.o. female who presents to the Emergency Department complaining of painful blisters to her vaginal area.  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  She was seen here 4 days ago for a recurrent outbreak.  She states she was given 3 days worth of medication which did not relieve her symptoms.  Patient was prescribed Valtrex.  She states that she has several blisters to her vaginal area that are now open and more painful.  She states her rash is similar to previous.  She is requesting a refill of Acyclovir.  She denies abdominal pain, vaginal bleeding or discharge, dysuria, vomiting, diarrhea, or fever.  She states that she is applying for Medicaid and currently does not have a PCP or gynecologist.  Past Medical History:  Diagnosis Date  . Anxiety   . Depression   . Herpes genitalia     Patient Active Problem List   Diagnosis Date Noted  . Left knee pain 03/01/2015  . Anxiety state 03/01/2015  . Obesity, unspecified 03/01/2015  . Cigarette nicotine dependence, uncomplicated 03/01/2015    Past Surgical History:  Procedure Laterality Date  . CHOLECYSTECTOMY  08-2008     OB History   None      Home Medications    Prior to Admission medications   Medication Sig Start Date End Date Taking? Authorizing Provider  acyclovir (ZOVIRAX) 400 MG tablet Take 1 tablet (400 mg total) by mouth 3 (three) times daily. For 10 days 03/06/17   Patrcia Schnepp, PA-C  albuterol (PROVENTIL HFA;VENTOLIN HFA) 108 (90 Base) MCG/ACT inhaler Inhale 2 puffs every 4 (four) hours as needed into the lungs for wheezing or shortness of breath. 11/29/16   Eber HongMiller, Brian, MD  benzonatate (TESSALON) 100 MG capsule  Take 1 capsule (100 mg total) every 8 (eight) hours by mouth. 11/29/16   Eber HongMiller, Brian, MD  HYDROcodone-acetaminophen (NORCO/VICODIN) 5-325 MG tablet Take one tab po q 4 hrs prn pain 03/06/17   Ralyn Stlaurent, PA-C  ibuprofen (ADVIL,MOTRIN) 600 MG tablet Take 1 tablet (600 mg total) by mouth every 6 (six) hours as needed. 03/06/17   Marshell Rieger, PA-C  valACYclovir (VALTREX) 500 MG tablet Take 1 tablet (500 mg total) by mouth 2 (two) times daily. 03/31/17   Bethel BornGekas, Kelly Marie, PA-C    Family History Family History  Problem Relation Age of Onset  . COPD Father   . Hypertension Maternal Grandfather     Social History Social History   Tobacco Use  . Smoking status: Former Smoker    Years: 10.00    Types: Cigarettes    Last attempt to quit: 01/15/2015    Years since quitting: 2.2  . Smokeless tobacco: Never Used  Substance Use Topics  . Alcohol use: Yes    Comment: occasionally  . Drug use: No     Allergies   Sulfa antibiotics   Review of Systems Review of Systems  Constitutional: Negative for activity change, appetite change, chills and fever.  HENT: Negative for facial swelling, sore throat and trouble swallowing.   Respiratory: Negative for chest tightness, shortness of breath and wheezing.   Gastrointestinal: Negative for abdominal pain, diarrhea, nausea and vomiting.  Genitourinary: Positive for genital sores. Negative for decreased urine volume, dysuria, vaginal bleeding, vaginal discharge and vaginal pain.  Musculoskeletal: Negative for neck pain and neck stiffness.  Skin: Positive for rash. Negative for wound.  Neurological: Negative for dizziness, weakness, numbness and headaches.  All other systems reviewed and are negative.    Physical Exam Updated Vital Signs BP 108/75 (BP Location: Right Arm)   Pulse 83   Temp 98.1 F (36.7 C) (Oral)   Resp 16   Ht 5\' 2"  (1.575 m)   Wt 90.7 kg (200 lb)   LMP 03/31/2017   SpO2 99%   BMI 36.58 kg/m   Physical Exam    Constitutional: She is oriented to person, place, and time. She appears well-developed and well-nourished. No distress.  HENT:  Mouth/Throat: Oropharynx is clear and moist.  Neck: Normal range of motion. Neck supple.  Cardiovascular: Normal rate, regular rhythm and intact distal pulses.  Pulmonary/Chest: Effort normal and breath sounds normal. No respiratory distress.  Abdominal: Soft. She exhibits no distension and no mass. There is no tenderness. There is no guarding.  Musculoskeletal: Normal range of motion.  Lymphadenopathy:    She has no cervical adenopathy.  Neurological: She is alert and oriented to person, place, and time. No sensory deficit.  Skin: Skin is warm. Capillary refill takes less than 2 seconds.  Psychiatric: She has a normal mood and affect.  Nursing note and vitals reviewed.    ED Treatments / Results  Labs (all labs ordered are listed, but only abnormal results are displayed) Labs Reviewed - No data to display  EKG None  Radiology No results found.  Procedures Procedures (including critical care time)  Medications Ordered in ED Medications - No data to display   Initial Impression / Assessment and Plan / ED Course  I have reviewed the triage vital signs and the nursing notes.  Pertinent labs & imaging results that were available during my care of the patient were reviewed by me and considered in my medical decision making (see chart for details).     Patient is well-appearing.  Vital signs are reassuring.  No abdominal pain.  She is here requesting a refill for acyclovir.  Patient was seen by me in February and had a positive HSV culture.  Since symptoms are similar to previous I will refill her acyclovir she will be given referral information for the health department and for the [facility].  Final Clinical Impressions(s) / ED Diagnoses   Final diagnoses:  Encounter for medication refill    ED Discharge Orders    None       Pauline Aus, PA-C 04/04/17 1916    Vanetta Mulders, MD 04/05/17 1547

## 2017-04-04 NOTE — ED Triage Notes (Signed)
Patient seen here on Monday and treated for herpes. Patient given Valtrex, per patient finished taking medication. Patient states no relief in pain and symptoms from herpes is progressively gotten worse.

## 2017-04-04 NOTE — Discharge Instructions (Addendum)
You may contact 1 of the clinics listed above to establish follow-up care.  Return to the ER for any worsening symptoms.

## 2017-04-25 ENCOUNTER — Other Ambulatory Visit: Payer: Self-pay

## 2017-04-25 ENCOUNTER — Emergency Department (HOSPITAL_COMMUNITY)
Admission: EM | Admit: 2017-04-25 | Discharge: 2017-04-25 | Disposition: A | Discharge status: Home / Self Care | Payer: Self-pay | Attending: Emergency Medicine | Admitting: Emergency Medicine

## 2017-04-25 ENCOUNTER — Encounter (HOSPITAL_COMMUNITY): Payer: Self-pay | Admitting: *Deleted

## 2017-04-25 DIAGNOSIS — Z79899 Other long term (current) drug therapy: Secondary | ICD-10-CM | POA: Insufficient documentation

## 2017-04-25 DIAGNOSIS — K529 Noninfective gastroenteritis and colitis, unspecified: Secondary | ICD-10-CM | POA: Insufficient documentation

## 2017-04-25 DIAGNOSIS — Z87891 Personal history of nicotine dependence: Secondary | ICD-10-CM | POA: Insufficient documentation

## 2017-04-25 LAB — CBC
HCT: 33.6 % — ABNORMAL LOW (ref 36.0–46.0)
Hemoglobin: 10.6 g/dL — ABNORMAL LOW (ref 12.0–15.0)
MCH: 26 pg (ref 26.0–34.0)
MCHC: 31.5 g/dL (ref 30.0–36.0)
MCV: 82.6 fL (ref 78.0–100.0)
PLATELETS: 219 10*3/uL (ref 150–400)
RBC: 4.07 MIL/uL (ref 3.87–5.11)
RDW: 14.4 % (ref 11.5–15.5)
WBC: 6.2 10*3/uL (ref 4.0–10.5)

## 2017-04-25 LAB — COMPREHENSIVE METABOLIC PANEL
ALT: 19 U/L (ref 14–54)
AST: 23 U/L (ref 15–41)
Albumin: 3.9 g/dL (ref 3.5–5.0)
Alkaline Phosphatase: 43 U/L (ref 38–126)
Anion gap: 10 (ref 5–15)
BUN: 10 mg/dL (ref 6–20)
CHLORIDE: 105 mmol/L (ref 101–111)
CO2: 22 mmol/L (ref 22–32)
CREATININE: 0.69 mg/dL (ref 0.44–1.00)
Calcium: 9.1 mg/dL (ref 8.9–10.3)
GFR calc Af Amer: >60 mL/min (ref >60)
Glucose, Bld: 91 mg/dL (ref 65–99)
POTASSIUM: 3.3 mmol/L — AB (ref 3.5–5.1)
Sodium: 137 mmol/L (ref 135–145)
Total Bilirubin: 0.5 mg/dL (ref 0.3–1.2)
Total Protein: 7.2 g/dL (ref 6.5–8.1)

## 2017-04-25 LAB — DIFFERENTIAL
BASOS PCT: 1 %
Basophils Absolute: 0 10*3/uL (ref 0.0–0.1)
EOS ABS: 0.3 10*3/uL (ref 0.0–0.7)
EOS PCT: 6 %
Lymphocytes Relative: 21 %
Lymphs Abs: 1.3 10*3/uL (ref 0.7–4.0)
Monocytes Absolute: 0.4 10*3/uL (ref 0.1–1.0)
Monocytes Relative: 7 %
NEUTROS PCT: 65 %
Neutro Abs: 4 10*3/uL (ref 1.7–7.7)

## 2017-04-25 LAB — I-STAT BETA HCG BLOOD, ED (MC, WL, AP ONLY): I-stat hCG, quantitative: <5 m[IU]/mL (ref <5)

## 2017-04-25 LAB — LIPASE, BLOOD: LIPASE: 28 U/L (ref 11–51)

## 2017-04-25 MED ORDER — KETOROLAC TROMETHAMINE 30 MG/ML IJ SOLN
30.0000 mg | Freq: Once | INTRAMUSCULAR | Status: AC
  Administered 2017-04-25: 30 mg via INTRAVENOUS
  Filled 2017-04-25: qty 1

## 2017-04-25 MED ORDER — TRAMADOL HCL 50 MG PO TABS: 50.0000 mg | ORAL_TABLET | Freq: Four times a day (QID) | ORAL | 0 refills | Status: DC | PRN

## 2017-04-25 MED ORDER — ONDANSETRON HCL 4 MG/2ML IJ SOLN
4.0000 mg | Freq: Once | INTRAMUSCULAR | Status: AC
  Administered 2017-04-25: 4 mg via INTRAVENOUS
  Filled 2017-04-25: qty 2

## 2017-04-25 MED ORDER — PANTOPRAZOLE SODIUM 40 MG IV SOLR
40.0000 mg | Freq: Once | INTRAVENOUS | Status: AC
  Administered 2017-04-25: 40 mg via INTRAVENOUS
  Filled 2017-04-25: qty 40

## 2017-04-25 MED ORDER — ONDANSETRON 4 MG PO TBDP: ORAL_TABLET | ORAL | 0 refills | Status: DC

## 2017-04-25 MED ORDER — FAMOTIDINE 20 MG PO TABS: 20.0000 mg | ORAL_TABLET | Freq: Two times a day (BID) | ORAL | 0 refills | Status: DC

## 2017-04-25 MED ORDER — SODIUM CHLORIDE 0.9 % IV BOLUS
1000.0000 mL | Freq: Once | INTRAVENOUS | Status: AC
  Administered 2017-04-25: 1000 mL via INTRAVENOUS

## 2017-04-25 NOTE — ED Provider Notes (Signed)
[facility] EMERGENCY DEPARTMENT Provider Note   CSN: [id number] Arrival date & time: 04/25/17  1834     History   Chief Complaint Chief Complaint  Patient presents with  . Abdominal Pain    HPI Kelli Barker is a 27 y.o. female.  Patient complains of vomiting and diarrhea and upper epigastric discomfort  The history is provided by the patient.  Abdominal Pain   This is a new problem. The current episode started 2 days ago. The problem occurs constantly. The problem has not changed since onset.The pain is associated with an unknown factor. The pain is located in the epigastric region. The quality of the pain is aching. The pain is at a severity of 4/10. The pain is mild. Associated symptoms include anorexia, diarrhea and vomiting. Pertinent negatives include frequency, hematuria and headaches. Nothing aggravates the symptoms.    Past Medical History:  Diagnosis Date  . Anxiety   . Depression   . Herpes genitalia     Patient Active Problem List   Diagnosis Date Noted  . Left knee pain 03/01/2015  . Anxiety state 03/01/2015  . Obesity, unspecified 03/01/2015  . Cigarette nicotine dependence, uncomplicated 03/01/2015    Past Surgical History:  Procedure Laterality Date  . CHOLECYSTECTOMY  08-2008     OB History   None      Home Medications    Prior to Admission medications   Medication Sig Start Date End Date Taking? Authorizing Provider  ibuprofen (ADVIL,MOTRIN) 600 MG tablet Take 1 tablet (600 mg total) by mouth every 6 (six) hours as needed. 03/06/17  Yes Triplett, Tammy, PA-C  acyclovir (ZOVIRAX) 400 MG tablet Take 1 tablet (400 mg total) by mouth 3 (three) times daily. Patient not taking: Reported on 04/25/2017 04/04/17   Triplett, Tammy, PA-C  albuterol (PROVENTIL HFA;VENTOLIN HFA) 108 (90 Base) MCG/ACT inhaler Inhale 2 puffs every 4 (four) hours as needed into the lungs for wheezing or shortness of breath. Patient not taking: Reported on 04/25/2017  11/29/16   Eber Hong, MD  benzonatate (TESSALON) 100 MG capsule Take 1 capsule (100 mg total) every 8 (eight) hours by mouth. Patient not taking: Reported on 04/25/2017 11/29/16   Eber Hong, MD  famotidine (PEPCID) 20 MG tablet Take 1 tablet (20 mg total) by mouth 2 (two) times daily. 04/25/17   Bethann Berkshire, MD  HYDROcodone-acetaminophen (NORCO/VICODIN) 5-325 MG tablet Take one tab po q 4 hrs prn pain Patient not taking: Reported on 04/25/2017 03/06/17   Pauline Aus, PA-C  ondansetron (ZOFRAN ODT) 4 MG disintegrating tablet 4mg  ODT q4 hours prn nausea/vomit 04/25/17   Bethann Berkshire, MD  traMADol (ULTRAM) 50 MG tablet Take 1 tablet (50 mg total) by mouth every 6 (six) hours as needed. 04/25/17   Bethann Berkshire, MD    Family History Family History  Problem Relation Age of Onset  . COPD Father   . Hypertension Maternal Grandfather     Social History Social History   Tobacco Use  . Smoking status: Former Smoker    Years: 10.00    Types: Cigarettes    Last attempt to quit: 01/15/2015    Years since quitting: 2.2  . Smokeless tobacco: Never Used  Substance Use Topics  . Alcohol use: Yes    Comment: occasionally  . Drug use: No     Allergies   Sulfa antibiotics   Review of Systems Review of Systems  Constitutional: Negative for appetite change and fatigue.  HENT: Negative for congestion, ear discharge and  sinus pressure.   Eyes: Negative for discharge.  Respiratory: Negative for cough.   Cardiovascular: Negative for chest pain.  Gastrointestinal: Positive for abdominal pain, anorexia, diarrhea and vomiting.  Genitourinary: Negative for frequency and hematuria.  Musculoskeletal: Negative for back pain.  Skin: Negative for rash.  Neurological: Negative for seizures and headaches.  Psychiatric/Behavioral: Negative for hallucinations.     Physical Exam Updated Vital Signs BP 113/64 (BP Location: Right Arm)   Pulse 73   Temp 98.3 F (36.8 C) (Oral)   Resp 18    Ht 5\' 2"  (1.575 m)   Wt 90.7 kg (200 lb)   LMP 03/24/2017   SpO2 100%   BMI 36.58 kg/m   Physical Exam  Constitutional: She is oriented to person, place, and time. She appears well-developed.  HENT:  Head: Normocephalic.  Eyes: Conjunctivae and EOM are normal. No scleral icterus.  Neck: Neck supple. No thyromegaly present.  Cardiovascular: Normal rate and regular rhythm. Exam reveals no gallop and no friction rub.  No murmur heard. Pulmonary/Chest: No stridor. She has no wheezes. She has no rales. She exhibits no tenderness.  Abdominal: She exhibits distension. There is no tenderness. There is no rebound.  Musculoskeletal: Normal range of motion. She exhibits no edema.  Lymphadenopathy:    She has no cervical adenopathy.  Neurological: She is oriented to person, place, and time. She exhibits normal muscle tone. Coordination normal.  Skin: No rash noted. No erythema.  Psychiatric: She has a normal mood and affect. Her behavior is normal.     ED Treatments / Results  Labs (all labs ordered are listed, but only abnormal results are displayed) Labs Reviewed  COMPREHENSIVE METABOLIC PANEL - Abnormal; Notable for the following components:      Result Value   Potassium 3.3 (*)    All other components within normal limits  CBC - Abnormal; Notable for the following components:   Hemoglobin 10.6 (*)    HCT 33.6 (*)    All other components within normal limits  LIPASE, BLOOD  DIFFERENTIAL  I-STAT BETA HCG BLOOD, ED ([facility], [facility], [facility] ONLY)    EKG None  Radiology No results found.  Procedures Procedures (including critical care time)  Medications Ordered in ED Medications  sodium chloride 0.9 % bolus 1,000 mL (1,000 mLs Intravenous New Bag/Given 04/25/17 1948)  pantoprazole (PROTONIX) injection 40 mg (40 mg Intravenous Given 04/25/17 1948)  ketorolac (TORADOL) 30 MG/ML injection 30 mg (30 mg Intravenous Given 04/25/17 1948)  ondansetron (ZOFRAN) injection 4 mg (4 mg Intravenous  Given 04/25/17 1948)     Initial Impression / Assessment and Plan / ED Course  I have reviewed the triage vital signs and the nursing notes.  Pertinent labs & imaging results that were available during my care of the patient were reviewed by me and considered in my medical decision making (see chart for details).   Patient with vomiting and diarrhea.  Labs unremarkable.  Patient improved with fluids and nausea medicine.  Suspect viral syndrome.  Patient will be discharged home and placed on Zofran Pepcid and Ultram and will follow-up next week if any problems  Final Clinical Impressions(s) / ED Diagnoses   Final diagnoses:  Gastroenteritis    ED Discharge Orders        Ordered    ondansetron (ZOFRAN ODT) 4 MG disintegrating tablet     04/25/17 2031    famotidine (PEPCID) 20 MG tablet  2 times daily     04/25/17 2031  traMADol (ULTRAM) 50 MG tablet  Every 6 hours PRN     04/25/17 2031       Bethann BerkshireZammit, Kelley Polinsky, MD 04/25/17 2034

## 2017-04-25 NOTE — Discharge Instructions (Addendum)
Drink plenty of fluids rest over the weekend and follow-up next week if any problems

## 2017-04-25 NOTE — ED Notes (Signed)
Pt ambulatory to waiting room. Pt verbalized understanding of discharge instructions.   

## 2017-04-25 NOTE — ED Triage Notes (Addendum)
Pt reports abdominal pain with diarrhea since earlier this morning. Pt also reports emesis as well.

## 2017-05-01 ENCOUNTER — Emergency Department (HOSPITAL_COMMUNITY)
Admission: EM | Admit: 2017-05-01 | Discharge: 2017-05-01 | Disposition: A | Discharge status: Home / Self Care | Payer: Self-pay | Attending: Emergency Medicine | Admitting: Emergency Medicine

## 2017-05-01 ENCOUNTER — Other Ambulatory Visit: Payer: Self-pay

## 2017-05-01 ENCOUNTER — Encounter (HOSPITAL_COMMUNITY): Payer: Self-pay | Admitting: *Deleted

## 2017-05-01 DIAGNOSIS — Z87891 Personal history of nicotine dependence: Secondary | ICD-10-CM | POA: Insufficient documentation

## 2017-05-01 DIAGNOSIS — Z760 Encounter for issue of repeat prescription: Secondary | ICD-10-CM | POA: Insufficient documentation

## 2017-05-01 MED ORDER — ACYCLOVIR 400 MG PO TABS: 400.0000 mg | ORAL_TABLET | Freq: Three times a day (TID) | ORAL | 0 refills | Status: DC

## 2017-05-01 NOTE — ED Triage Notes (Signed)
States she needs meds for herpes outbreak

## 2017-05-01 NOTE — ED Provider Notes (Signed)
[facility] EMERGENCY DEPARTMENT Provider Note   CSN: [id number] Arrival date & time: 05/01/17  1650     History   Chief Complaint Chief Complaint  Patient presents with  . Medication Refill    HPI Kelli Barker is a 27 y.o. female.  Patient states she was recently diagnosed with herpes.  She states that she works in extremely hectic schedule, and she has 3 children.  She is not been able to seek a primary physician at this point.  She requests a refill on her medication.  The history is provided by the patient.  Medication Refill  Medications/supplies requested:  Acyclovir Reason for request:  Medications ran out Medications taken before: yes - see home medications   Patient has complete original prescription information: yes     Past Medical History:  Diagnosis Date  . Anxiety   . Depression   . Herpes genitalia     Patient Active Problem List   Diagnosis Date Noted  . Left knee pain 03/01/2015  . Anxiety state 03/01/2015  . Obesity, unspecified 03/01/2015  . Cigarette nicotine dependence, uncomplicated 03/01/2015    Past Surgical History:  Procedure Laterality Date  . CHOLECYSTECTOMY  08-2008     OB History   None      Home Medications    Prior to Admission medications   Medication Sig Start Date End Date Taking? Authorizing Provider  acyclovir (ZOVIRAX) 400 MG tablet Take 1 tablet (400 mg total) by mouth 3 (three) times daily. Patient not taking: Reported on 04/25/2017 04/04/17   Triplett, Tammy, PA-C  albuterol (PROVENTIL HFA;VENTOLIN HFA) 108 (90 Base) MCG/ACT inhaler Inhale 2 puffs every 4 (four) hours as needed into the lungs for wheezing or shortness of breath. Patient not taking: Reported on 04/25/2017 11/29/16   Eber Hong, MD  benzonatate (TESSALON) 100 MG capsule Take 1 capsule (100 mg total) every 8 (eight) hours by mouth. Patient not taking: Reported on 04/25/2017 11/29/16   Eber Hong, MD  famotidine (PEPCID) 20 MG tablet Take 1  tablet (20 mg total) by mouth 2 (two) times daily. 04/25/17   Bethann Berkshire, MD  HYDROcodone-acetaminophen (NORCO/VICODIN) 5-325 MG tablet Take one tab po q 4 hrs prn pain Patient not taking: Reported on 04/25/2017 03/06/17   Triplett, Tammy, PA-C  ibuprofen (ADVIL,MOTRIN) 600 MG tablet Take 1 tablet (600 mg total) by mouth every 6 (six) hours as needed. 03/06/17   Triplett, Tammy, PA-C  ondansetron (ZOFRAN ODT) 4 MG disintegrating tablet 4mg  ODT q4 hours prn nausea/vomit 04/25/17   Bethann Berkshire, MD  traMADol (ULTRAM) 50 MG tablet Take 1 tablet (50 mg total) by mouth every 6 (six) hours as needed. 04/25/17   Bethann Berkshire, MD    Family History Family History  Problem Relation Age of Onset  . COPD Father   . Hypertension Maternal Grandfather     Social History Social History   Tobacco Use  . Smoking status: Former Smoker    Years: 10.00    Types: Cigarettes    Last attempt to quit: 01/15/2015    Years since quitting: 2.2  . Smokeless tobacco: Never Used  Substance Use Topics  . Alcohol use: Yes    Comment: occasionally  . Drug use: No     Allergies   Sulfa antibiotics   Review of Systems Review of Systems  Constitutional: Negative for activity change.       All ROS Neg except as noted in HPI  HENT: Negative for nosebleeds.   Eyes:  Negative for photophobia and discharge.  Respiratory: Negative for cough, shortness of breath and wheezing.   Cardiovascular: Negative for chest pain and palpitations.  Gastrointestinal: Negative for abdominal pain and blood in stool.  Genitourinary: Negative for dysuria, frequency and hematuria.       Genital sores  Musculoskeletal: Negative for arthralgias, back pain and neck pain.  Skin: Positive for rash.       Genital rash  Neurological: Negative for dizziness, seizures and speech difficulty.  Psychiatric/Behavioral: Negative for confusion and hallucinations.     Physical Exam Updated Vital Signs BP 119/79 (BP Location: Right Arm)    Pulse 80   Temp 98.5 F (36.9 C) (Oral)   Resp 18   Ht 5\' 2"  (1.575 m)   Wt 90.7 kg (200 lb)   LMP 04/22/2017 (Approximate)   SpO2 100%   BMI 36.58 kg/m   Physical Exam  Constitutional: She is oriented to person, place, and time. She appears well-developed and well-nourished.  Non-toxic appearance.  HENT:  Head: Normocephalic.  Right Ear: Tympanic membrane and external ear normal.  Left Ear: Tympanic membrane and external ear normal.  Eyes: Pupils are equal, round, and reactive to light. EOM and lids are normal.  Neck: Normal range of motion. Neck supple. Carotid bruit is not present.  Cardiovascular: Normal rate, regular rhythm, normal heart sounds, intact distal pulses and normal pulses.  Pulmonary/Chest: Breath sounds normal. No respiratory distress.  Abdominal: Soft. Bowel sounds are normal. There is no tenderness. There is no guarding.  Musculoskeletal: Normal range of motion.  Lymphadenopathy:       Head (right side): No submandibular adenopathy present.       Head (left side): No submandibular adenopathy present.    She has no cervical adenopathy.  Neurological: She is alert and oriented to person, place, and time. She has normal strength. No cranial nerve deficit or sensory deficit.  Skin: Skin is warm and dry.  Psychiatric: She has a normal mood and affect. Her speech is normal.  Nursing note and vitals reviewed.    ED Treatments / Results  Labs (all labs ordered are listed, but only abnormal results are displayed) Labs Reviewed - No data to display  EKG None  Radiology No results found.  Procedures Procedures (including critical care time)  Medications Ordered in ED Medications - No data to display   Initial Impression / Assessment and Plan / ED Course  I have reviewed the triage vital signs and the nursing notes.  Pertinent labs & imaging results that were available during my care of the patient were reviewed by me and considered in my medical  decision making (see chart for details).      Final Clinical Impressions(s) / ED Diagnoses MDM  Vital signs within normal limits.  Patient states that she has noticed over the last 2 days that she has a blister type rash on the vaginal labia and extending down to the rectal area similar to to previous herpes outbreaks.  She states that she works in extremely hectic schedule and has 3 children that she has to keep up with and is not been able to obtain a primary physician.  She is requesting refill on her acyclovir.  Prescription for acyclovir 3 times daily given to the patient.  I have also given the patient resources for [facility], as well as the local health department.  Patient is in agreement with this plan.   Final diagnoses:  Medication refill  ED Discharge Orders        Ordered    acyclovir (ZOVIRAX) 400 MG tablet  3 times daily     05/01/17 1719       Ivery Quale, PA-C 05/01/17 1730    Donnetta Hutching, MD 05/03/17 [phone]

## 2017-05-01 NOTE — Discharge Instructions (Addendum)
Your vital signs are within normal limits.  Your oxygen level is 100% on room air.  A prescription has been provided for your medication.  [facility], and/or [facility] to establish a primary physician and assist you with your medications.

## 2017-07-10 ENCOUNTER — Emergency Department (HOSPITAL_COMMUNITY)
Admission: EM | Admit: 2017-07-10 | Discharge: 2017-07-10 | Disposition: A | Discharge status: Home / Self Care | Payer: Self-pay | Attending: Emergency Medicine | Admitting: Emergency Medicine

## 2017-07-10 ENCOUNTER — Encounter (HOSPITAL_COMMUNITY): Payer: Self-pay | Admitting: Emergency Medicine

## 2017-07-10 ENCOUNTER — Other Ambulatory Visit: Payer: Self-pay

## 2017-07-10 DIAGNOSIS — Z79899 Other long term (current) drug therapy: Secondary | ICD-10-CM | POA: Insufficient documentation

## 2017-07-10 DIAGNOSIS — N390 Urinary tract infection, site not specified: Secondary | ICD-10-CM | POA: Insufficient documentation

## 2017-07-10 DIAGNOSIS — Z87891 Personal history of nicotine dependence: Secondary | ICD-10-CM | POA: Insufficient documentation

## 2017-07-10 LAB — URINALYSIS, ROUTINE W REFLEX MICROSCOPIC
Bilirubin Urine: NEGATIVE
GLUCOSE, UA: NEGATIVE mg/dL
Ketones, ur: NEGATIVE mg/dL
NITRITE: NEGATIVE
PH: 6 (ref 5.0–8.0)
Protein, ur: NEGATIVE mg/dL
SPECIFIC GRAVITY, URINE: 1.025 (ref 1.005–1.030)

## 2017-07-10 LAB — POC URINE PREG, ED: Preg Test, Ur: NEGATIVE

## 2017-07-10 MED ORDER — IBUPROFEN 800 MG PO TABS: 800.0000 mg | ORAL_TABLET | Freq: Three times a day (TID) | ORAL | 0 refills | Status: DC

## 2017-07-10 MED ORDER — HYDROCODONE-ACETAMINOPHEN 5-325 MG PO TABS
1.0000 | ORAL_TABLET | Freq: Once | ORAL | Status: AC
  Administered 2017-07-10: 1 via ORAL
  Filled 2017-07-10: qty 1

## 2017-07-10 MED ORDER — CYCLOBENZAPRINE HCL 10 MG PO TABS
10.0000 mg | ORAL_TABLET | Freq: Once | ORAL | Status: AC
Start: 2017-07-10 — End: 2017-07-10
  Administered 2017-07-10: 10 mg via ORAL
  Filled 2017-07-10: qty 1

## 2017-07-10 MED ORDER — CEPHALEXIN 500 MG PO CAPS
500.0000 mg | ORAL_CAPSULE | Freq: Once | ORAL | Status: AC
  Administered 2017-07-10: 500 mg via ORAL
  Filled 2017-07-10: qty 1

## 2017-07-10 MED ORDER — CEPHALEXIN 500 MG PO CAPS: 500.0000 mg | ORAL_CAPSULE | Freq: Four times a day (QID) | ORAL | 0 refills | Status: DC

## 2017-07-10 NOTE — ED Triage Notes (Signed)
Pt c/o of pain in the middle of her back starting yesterday. Denies any new injuries or falls.  Pain with ambulation.

## 2017-07-10 NOTE — Discharge Instructions (Addendum)
Take the antibiotic as directed until it is finished.  Drink plenty of water.  You may also drink cranberry juice.  Follow-up with your primary doctor or return to the ER for any worsening symptoms such as increasing pain, fever, and vomiting

## 2017-07-10 NOTE — ED Provider Notes (Signed)
[facility] EMERGENCY DEPARTMENT Provider Note   CSN: [id number] Arrival date & time: 07/10/17  1622     History   Chief Complaint Chief Complaint  Patient presents with  . Back Pain    HPI Kelli Barker is a 27 y.o. female.  HPI   Kelli Barker is a 27 y.o. female who presents to the Emergency Department complaining of diffuse low back pain for one day.  Describes an aching pain across her lower back.  Pain is associated with standing, bending over.  Pain improves at rest.  She has tried OTC ibuprofen this morning without relief.  No reported injury, but admits to picking her her 33 year old daughter yesterday.  Some increased urinary frequency, but otherwise denies urine or bowel changes, abdominal pain, pain, numbness or weakness of the lower extremities, fever, vaginal discharge or abnormal vaginal bleeding.     Past Medical History:  Diagnosis Date  . Anxiety   . Depression   . Herpes genitalia     Patient Active Problem List   Diagnosis Date Noted  . Left knee pain 03/01/2015  . Anxiety state 03/01/2015  . Obesity, unspecified 03/01/2015  . Cigarette nicotine dependence, uncomplicated 03/01/2015    Past Surgical History:  Procedure Laterality Date  . CHOLECYSTECTOMY  08-2008     OB History   None      Home Medications    Prior to Admission medications   Medication Sig Start Date End Date Taking? Authorizing Provider  acyclovir (ZOVIRAX) 400 MG tablet Take 1 tablet (400 mg total) by mouth 3 (three) times daily. 05/01/17   Ivery Quale, PA-C  albuterol (PROVENTIL HFA;VENTOLIN HFA) 108 (90 Base) MCG/ACT inhaler Inhale 2 puffs every 4 (four) hours as needed into the lungs for wheezing or shortness of breath. Patient not taking: Reported on 04/25/2017 11/29/16   Barker Hong, MD  benzonatate (TESSALON) 100 MG capsule Take 1 capsule (100 mg total) every 8 (eight) hours by mouth. Patient not taking: Reported on 04/25/2017 11/29/16   Barker Hong, MD    famotidine (PEPCID) 20 MG tablet Take 1 tablet (20 mg total) by mouth 2 (two) times daily. 04/25/17   Bethann Berkshire, MD  HYDROcodone-acetaminophen (NORCO/VICODIN) 5-325 MG tablet Take one tab po q 4 hrs prn pain Patient not taking: Reported on 04/25/2017 03/06/17   Owen Pagnotta, PA-C  ibuprofen (ADVIL,MOTRIN) 600 MG tablet Take 1 tablet (600 mg total) by mouth every 6 (six) hours as needed. 03/06/17   Haiden Rawlinson, PA-C  ondansetron (ZOFRAN ODT) 4 MG disintegrating tablet 4mg  ODT q4 hours prn nausea/vomit 04/25/17   Bethann Berkshire, MD  traMADol (ULTRAM) 50 MG tablet Take 1 tablet (50 mg total) by mouth every 6 (six) hours as needed. 04/25/17   Bethann Berkshire, MD    Family History Family History  Problem Relation Age of Onset  . COPD Father   . Hypertension Maternal Grandfather     Social History Social History   Tobacco Use  . Smoking status: Former Smoker    Years: 10.00    Types: Cigarettes    Last attempt to quit: 01/15/2015    Years since quitting: 2.4  . Smokeless tobacco: Never Used  Substance Use Topics  . Alcohol use: Yes    Comment: occasionally  . Drug use: No     Allergies   Sulfa antibiotics   Review of Systems Review of Systems  Constitutional: Negative for fever.  Respiratory: Negative for shortness of breath.   Gastrointestinal: Negative for  abdominal pain, constipation and vomiting.  Genitourinary: Negative for decreased urine volume, difficulty urinating, dysuria, flank pain and hematuria.  Musculoskeletal: Positive for back pain. Negative for joint swelling.  Skin: Negative for rash.  Neurological: Negative for weakness and numbness.  All other systems reviewed and are negative.    Physical Exam Updated Vital Signs BP 111/83 (BP Location: Right Arm)   Pulse 85   Temp 99.6 F (37.6 C) (Oral)   Resp 18   Ht 5\' 2"  (1.575 m)   Wt 90.7 kg (200 lb)   SpO2 100%   BMI 36.58 kg/m   Physical Exam  Constitutional: She is oriented to person,  place, and time. She appears well-developed and well-nourished. No distress.  HENT:  Head: Normocephalic and atraumatic.  Neck: Normal range of motion. Neck supple.  Cardiovascular: Normal rate, regular rhythm and intact distal pulses.  DP pulses are strong and palpable bilaterally  Pulmonary/Chest: Effort normal and breath sounds normal. No respiratory distress.  Abdominal: Soft. Normal appearance. She exhibits no distension. There is no tenderness. There is no guarding.  No CVA tenderness  Musculoskeletal: She exhibits tenderness. She exhibits no edema.       Lumbar back: She exhibits tenderness and pain. She exhibits normal range of motion, no swelling, no deformity, no laceration and normal pulse.  ttp of the bilateral lower lumbar paraspinal muscles.  No spinal tenderness.  Pt has 5/5 strength against resistance of bilateral lower extremities.  Hip flexors and extensors are intact   Neurological: She is alert and oriented to person, place, and time. She has normal strength. No sensory deficit. She exhibits normal muscle tone. Coordination and gait normal.  Skin: Skin is warm and dry. Capillary refill takes less than 2 seconds. No rash noted.  Nursing note and vitals reviewed.    ED Treatments / Results  Labs (all labs ordered are listed, but only abnormal results are displayed) Labs Reviewed  URINALYSIS, ROUTINE W REFLEX MICROSCOPIC - Abnormal; Notable for the following components:      Result Value   Hgb urine dipstick MODERATE (*)    Leukocytes, UA TRACE (*)    Bacteria, UA RARE (*)    All other components within normal limits  URINE CULTURE  POC URINE PREG, ED    EKG None  Radiology No results found.  Procedures Procedures (including critical care time)  Medications Ordered in ED Medications  cyclobenzaprine (FLEXERIL) tablet 10 mg (has no administration in time range)  HYDROcodone-acetaminophen (NORCO/VICODIN) 5-325 MG per tablet 1 tablet (has no administration  in time range)     Initial Impression / Assessment and Plan / ED Course  I have reviewed the triage vital signs and the nursing notes.  Pertinent labs & imaging results that were available during my care of the patient were reviewed by me and considered in my medical decision making (see chart for details).     Patient with low back pain and urinalysis shows likely UTI.  Urine culture pending.  No fever, vomiting, or CVA tenderness patient is otherwise well-appearing.  Ambulatory with steady gait.  No neurological deficits.  Will treat with Keflex and ibuprofen.  Patient agrees to plan and return precautions discussed.  Final Clinical Impressions(s) / ED Diagnoses   Final diagnoses:  Urinary tract infection in female    ED Discharge Orders    None       Rosey Bathriplett, Esly Selvage, PA-C 07/10/17 Charmayne [id number]    Kohut, Stephen, MD 07/10/17 2052

## 2017-07-12 LAB — URINE CULTURE

## 2017-07-13 ENCOUNTER — Telehealth: Payer: Self-pay

## 2017-07-13 NOTE — Telephone Encounter (Signed)
Post ED Visit - Positive Culture Follow-up  Culture report reviewed by antimicrobial stewardship pharmacist:  []  Enzo BiNathan Batchelder, Pharm.D. []  Celedonio MiyamotoJeremy Frens, Pharm.D., BCPS AQ-ID []  Garvin FilaMike Maccia, Pharm.D., BCPS []  Georgina PillionElizabeth Martin, [address] BCPS []  [place] Lil[place]h Pham, [address]Ph[address]., BCPS, AAHIVP []  Estella HuskMichelle Turner, Pharm.D., BCPS, AAHIVP []  Lysle Pearlachel Rumbarger, PharmD, BCPS []  Sherlynn CarbonAustin Lucas, PharmD []  Pollyann SamplesAndy Johnston, PharmD, BCPS [facility] Pharm D Positive urine culture Treated with Cephalexin, organism sensitive to the same and no further patient follow-up is required at this time.  Jerry CarasCullom, Welby Montminy Burnett 07/13/2017, 9:56 AM

## 2017-10-23 ENCOUNTER — Other Ambulatory Visit: Payer: Self-pay

## 2017-10-23 ENCOUNTER — Emergency Department (HOSPITAL_COMMUNITY)
Admission: EM | Admit: 2017-10-23 | Discharge: 2017-10-23 | Disposition: A | Discharge status: Home / Self Care | Payer: Self-pay | Attending: Emergency Medicine | Admitting: Emergency Medicine

## 2017-10-23 ENCOUNTER — Encounter (HOSPITAL_COMMUNITY): Payer: Self-pay

## 2017-10-23 ENCOUNTER — Emergency Department (HOSPITAL_COMMUNITY): Payer: Self-pay

## 2017-10-23 DIAGNOSIS — Z79899 Other long term (current) drug therapy: Secondary | ICD-10-CM | POA: Insufficient documentation

## 2017-10-23 DIAGNOSIS — J029 Acute pharyngitis, unspecified: Secondary | ICD-10-CM | POA: Insufficient documentation

## 2017-10-23 DIAGNOSIS — Z87891 Personal history of nicotine dependence: Secondary | ICD-10-CM | POA: Insufficient documentation

## 2017-10-23 DIAGNOSIS — R059 Cough, unspecified: Secondary | ICD-10-CM

## 2017-10-23 DIAGNOSIS — R05 Cough: Secondary | ICD-10-CM | POA: Insufficient documentation

## 2017-10-23 MED ORDER — PREDNISONE 20 MG PO TABS: 40.0000 mg | ORAL_TABLET | Freq: Every day | ORAL | 0 refills | Status: DC

## 2017-10-23 MED ORDER — BENZONATATE 200 MG PO CAPS: 200.0000 mg | ORAL_CAPSULE | Freq: Three times a day (TID) | ORAL | 0 refills | Status: DC | PRN

## 2017-10-23 MED ORDER — ALBUTEROL SULFATE HFA 108 (90 BASE) MCG/ACT IN AERS
2.0000 | INHALATION_SPRAY | Freq: Once | RESPIRATORY_TRACT | Status: AC
  Administered 2017-10-23: 2 via RESPIRATORY_TRACT
  Filled 2017-10-23: qty 6.7

## 2017-10-23 NOTE — ED Triage Notes (Signed)
Pt has been having a consistent cough for the last [redacted] weeks along with a sore throat and chest soreness from coughing so much. NAD. Pt has been taking ibuprofen. Was talking Robitussin without relief. Has also tried Zyrtec. Dry hacking cough.

## 2017-10-23 NOTE — Progress Notes (Signed)
**Note De-Identified Kelli Barker Obfuscation** Patient educated on MDI delivery with airflow training tool. Patient tolerated well.

## 2017-10-23 NOTE — Discharge Instructions (Addendum)
1 to 2 puffs of the albuterol inhaler every 4-6 hours as needed.  You may also want to take an over-the-counter allergy medication such as Claritin or Zyrtec once daily.  Follow-up with your primary doctor or return to the ER for any worsening symptoms.

## 2017-10-26 NOTE — ED Provider Notes (Signed)
[facility] EMERGENCY DEPARTMENT Provider Note   CSN: [id number] Arrival date & time: 10/23/17  1029     History   Chief Complaint Chief Complaint  Patient presents with  . Sore Throat    HPI Kelli Barker is a 27 y.o. female.  HPI   Kelli Barker is a 27 y.o. female who presents to the Emergency Department complaining of sore throat and cough.  Symptoms present for several weeks.  Cough describes as non-productive.  Cough associated with bilateral chest wall "soreness" no fever or shortness of breath.  Taking OTC cold and cough medications without relief. Admits to possible allergies.       Past Medical History:  Diagnosis Date  . Anxiety   . Depression   . Herpes genitalia     Patient Active Problem List   Diagnosis Date Noted  . Left knee pain 03/01/2015  . Anxiety state 03/01/2015  . Obesity, unspecified 03/01/2015  . Cigarette nicotine dependence, uncomplicated 03/01/2015    Past Surgical History:  Procedure Laterality Date  . CHOLECYSTECTOMY  08-2008     OB History   None      Home Medications    Prior to Admission medications   Medication Sig Start Date End Date Taking? Authorizing Provider  acyclovir (ZOVIRAX) 400 MG tablet Take 1 tablet (400 mg total) by mouth 3 (three) times daily. 05/01/17   Ivery Quale, PA-C  albuterol (PROVENTIL HFA;VENTOLIN HFA) 108 (90 Base) MCG/ACT inhaler Inhale 2 puffs every 4 (four) hours as needed into the lungs for wheezing or shortness of breath. Patient not taking: Reported on 04/25/2017 11/29/16   Eber Hong, MD  benzonatate (TESSALON) 200 MG capsule Take 1 capsule (200 mg total) by mouth 3 (three) times daily as needed for cough. Swallow whole, do not chew 10/23/17   Joy Reiger, PA-C  cephALEXin (KEFLEX) 500 MG capsule Take 1 capsule (500 mg total) by mouth 4 (four) times daily. 07/10/17   Ejay Lashley, PA-C  famotidine (PEPCID) 20 MG tablet Take 1 tablet (20 mg total) by mouth 2 (two) times daily.  04/25/17   Bethann Berkshire, MD  HYDROcodone-acetaminophen (NORCO/VICODIN) 5-325 MG tablet Take one tab po q 4 hrs prn pain Patient not taking: Reported on 04/25/2017 03/06/17   Prosperity Darrough, PA-C  ibuprofen (ADVIL,MOTRIN) 800 MG tablet Take 1 tablet (800 mg total) by mouth 3 (three) times daily. 07/10/17   Jhayden Demuro, PA-C  ondansetron (ZOFRAN ODT) 4 MG disintegrating tablet 4mg  ODT q4 hours prn nausea/vomit 04/25/17   Bethann Berkshire, MD  predniSONE (DELTASONE) 20 MG tablet Take 2 tablets (40 mg total) by mouth daily. 10/23/17   Sorina Derrig, PA-C  traMADol (ULTRAM) 50 MG tablet Take 1 tablet (50 mg total) by mouth every 6 (six) hours as needed. 04/25/17   Bethann Berkshire, MD    Family History Family History  Problem Relation Age of Onset  . COPD Father   . Hypertension Maternal Grandfather     Social History Social History   Tobacco Use  . Smoking status: Former Smoker    Years: 10.00    Types: Cigarettes    Last attempt to quit: 01/15/2015    Years since quitting: 2.7  . Smokeless tobacco: Never Used  Substance Use Topics  . Alcohol use: Yes    Comment: occasionally  . Drug use: No     Allergies   Sulfa antibiotics   Review of Systems Review of Systems  Constitutional: Negative for appetite change, chills and fever.  HENT:  Positive for congestion and sore throat. Negative for trouble swallowing.   Respiratory: Positive for cough and chest tightness. Negative for shortness of breath and wheezing.   Cardiovascular: Negative for chest pain (chest wall pain with cough).  Gastrointestinal: Negative for abdominal pain, nausea and vomiting.  Genitourinary: Negative for dysuria.  Musculoskeletal: Negative for arthralgias and neck pain.  Skin: Negative for rash.  Neurological: Negative for dizziness, weakness and numbness.  Hematological: Negative for adenopathy.     Physical Exam Updated Vital Signs BP 119/70 (BP Location: Right Arm)   Pulse 75   Temp 98.6 F (37  C) (Oral)   Resp 17   Ht 5\' 1"  (1.549 m)   Wt 90.7 kg   LMP 10/21/2017   SpO2 100%   BMI 37.79 kg/m   Physical Exam  Constitutional: She appears well-developed and well-nourished. No distress.  HENT:  Head: Atraumatic.  Right Ear: Tympanic membrane and ear canal normal.  Left Ear: Tympanic membrane and ear canal normal.  Mouth/Throat: Uvula is midline and mucous membranes are normal. No oral lesions. Posterior oropharyngeal erythema present. No oropharyngeal exudate, posterior oropharyngeal edema or tonsillar abscesses.  Neck: Normal range of motion, full passive range of motion without pain and phonation normal. Neck supple.  Cardiovascular: Normal rate, regular rhythm, normal heart sounds and intact distal pulses.  No murmur heard. Pulmonary/Chest: Effort normal. No stridor. No respiratory distress. She has no rales. She exhibits no tenderness.  Coarse lungs sounds bilaterally with occasional expiratory wheezes.  No rales  Musculoskeletal: Normal range of motion. She exhibits no edema.  Lymphadenopathy:    She has no cervical adenopathy.  Neurological: She is alert. She exhibits normal muscle tone.  Skin: Skin is warm.  Psychiatric: She has a normal mood and affect.  Nursing note and vitals reviewed.    ED Treatments / Results  Labs (all labs ordered are listed, but only abnormal results are displayed) Labs Reviewed - No data to display  EKG None  Radiology Dg Chest 2 View  Result Date: 10/23/2017 CLINICAL DATA:  Shortness of breath and cough for 7 weeks. EXAM: CHEST - 2 VIEW COMPARISON:  None. FINDINGS: The cardiomediastinal silhouette is unremarkable. There is no evidence of focal airspace disease, pulmonary edema, suspicious pulmonary nodule/mass, pleural effusion, or pneumothorax. No acute bony abnormalities are identified. IMPRESSION: No active cardiopulmonary disease. Electronically Signed   By: Harmon Pier M.D.   On: 10/23/2017 11:18     Procedures Procedures  (including critical care time)  Medications Ordered in ED Medications  albuterol (PROVENTIL HFA;VENTOLIN HFA) 108 (90 Base) MCG/ACT inhaler 2 puff (2 puffs Inhalation Given 10/23/17 1220)     Initial Impression / Assessment and Plan / ED Course  I have reviewed the triage vital signs and the nursing notes.  Pertinent labs & imaging results that were available during my care of the patient were reviewed by me and considered in my medical decision making (see chart for details).     CXR reassuring as well as vitals.  No concerning sx's for PE.  Pt well appearing.  Albuterol MDI dispensed.  Pt agrees to tx plan and close outpatient f/u.  Return precautions discussed.   Final Clinical Impressions(s) / ED Diagnoses   Final diagnoses:  Pharyngitis, unspecified etiology  Cough    ED Discharge Orders         Ordered    benzonatate (TESSALON) 200 MG capsule  3 times daily PRN     10/23/17 1154  predniSONE (DELTASONE) 20 MG tablet  Daily     10/23/17 1154           Pauline Aus, PA-C 10/26/17 0050    Bethann Berkshire, MD 10/26/17 1531

## 2017-11-26 ENCOUNTER — Other Ambulatory Visit: Payer: Self-pay

## 2017-11-26 ENCOUNTER — Emergency Department (HOSPITAL_COMMUNITY): Payer: Self-pay

## 2017-11-26 ENCOUNTER — Encounter (HOSPITAL_COMMUNITY): Payer: Self-pay

## 2017-11-26 ENCOUNTER — Emergency Department (HOSPITAL_COMMUNITY)
Admission: EM | Admit: 2017-11-26 | Discharge: 2017-11-26 | Disposition: A | Discharge status: Home / Self Care | Payer: Self-pay | Attending: Emergency Medicine | Admitting: Emergency Medicine

## 2017-11-26 DIAGNOSIS — J069 Acute upper respiratory infection, unspecified: Secondary | ICD-10-CM | POA: Insufficient documentation

## 2017-11-26 DIAGNOSIS — Z79899 Other long term (current) drug therapy: Secondary | ICD-10-CM | POA: Insufficient documentation

## 2017-11-26 DIAGNOSIS — Z87891 Personal history of nicotine dependence: Secondary | ICD-10-CM | POA: Insufficient documentation

## 2017-11-26 DIAGNOSIS — N3001 Acute cystitis with hematuria: Secondary | ICD-10-CM | POA: Insufficient documentation

## 2017-11-26 LAB — URINALYSIS, ROUTINE W REFLEX MICROSCOPIC
Bilirubin Urine: NEGATIVE
GLUCOSE, UA: NEGATIVE mg/dL
Ketones, ur: NEGATIVE mg/dL
Nitrite: POSITIVE — AB
PH: 5 (ref 5.0–8.0)
PROTEIN: 30 mg/dL — AB
Specific Gravity, Urine: 1.024 (ref 1.005–1.030)
Squamous Epithelial / LPF: >50 — ABNORMAL HIGH (ref 0–5)

## 2017-11-26 MED ORDER — CEPHALEXIN 500 MG PO CAPS: 500.0000 mg | ORAL_CAPSULE | Freq: Four times a day (QID) | ORAL | 0 refills | Status: DC

## 2017-11-26 NOTE — Discharge Instructions (Signed)
Return if any problems.

## 2017-11-26 NOTE — ED Provider Notes (Signed)
[facility] EMERGENCY DEPARTMENT Provider Note   CSN: [id number] Arrival date & time: 11/26/17  1050     History   Chief Complaint Chief Complaint  Patient presents with  . Cough    HPI Kelli Barker is a 27 y.o. female.  The history is provided by the patient. No language interpreter was used.  Cough  This is a new problem. The current episode started 2 days ago. The problem occurs constantly. The problem has been gradually worsening. The cough is non-productive. There has been no fever. Pertinent negatives include no rhinorrhea. She has tried nothing for the symptoms. The treatment provided no relief. Her past medical history does not include pneumonia.  Pt reports she also has burning with urination.  Pt thinks she has a uti  Past Medical History:  Diagnosis Date  . Anxiety   . Depression   . Herpes genitalia     Patient Active Problem List   Diagnosis Date Noted  . Left knee pain 03/01/2015  . Anxiety state 03/01/2015  . Obesity, unspecified 03/01/2015  . Cigarette nicotine dependence, uncomplicated 03/01/2015    Past Surgical History:  Procedure Laterality Date  . CHOLECYSTECTOMY  08-2008     OB History   None      Home Medications    Prior to Admission medications   Medication Sig Start Date End Date Taking? Authorizing Provider  acyclovir (ZOVIRAX) 400 MG tablet Take 1 tablet (400 mg total) by mouth 3 (three) times daily. 05/01/17   Ivery QualeBryant, Hobson, PA-C  albuterol (PROVENTIL HFA;VENTOLIN HFA) 108 (90 Base) MCG/ACT inhaler Inhale 2 puffs every 4 (four) hours as needed into the lungs for wheezing or shortness of breath. Patient not taking: Reported on 04/25/2017 11/29/16   Eber HongMiller, Brian, MD  benzonatate (TESSALON) 200 MG capsule Take 1 capsule (200 mg total) by mouth 3 (three) times daily as needed for cough. Swallow whole, do not chew 10/23/17   Triplett, Tammy, PA-C  cephALEXin (KEFLEX) 500 MG capsule Take 1 capsule (500 mg total) by mouth 4 (four)  times daily. 11/26/17   Elson AreasSofia, Franceska Strahm K, PA-C  famotidine (PEPCID) 20 MG tablet Take 1 tablet (20 mg total) by mouth 2 (two) times daily. 04/25/17   Bethann BerkshireZammit, Joseph, MD  HYDROcodone-acetaminophen (NORCO/VICODIN) 5-325 MG tablet Take one tab po q 4 hrs prn pain Patient not taking: Reported on 04/25/2017 03/06/17   Triplett, Tammy, PA-C  ibuprofen (ADVIL,MOTRIN) 800 MG tablet Take 1 tablet (800 mg total) by mouth 3 (three) times daily. 07/10/17   Triplett, Tammy, PA-C  ondansetron (ZOFRAN ODT) 4 MG disintegrating tablet 4mg  ODT q4 hours prn nausea/vomit 04/25/17   Bethann BerkshireZammit, Joseph, MD  predniSONE (DELTASONE) 20 MG tablet Take 2 tablets (40 mg total) by mouth daily. 10/23/17   Triplett, Tammy, PA-C  traMADol (ULTRAM) 50 MG tablet Take 1 tablet (50 mg total) by mouth every 6 (six) hours as needed. 04/25/17   Bethann BerkshireZammit, Joseph, MD    Family History Family History  Problem Relation Age of Onset  . COPD Father   . Hypertension Maternal Grandfather     Social History Social History   Tobacco Use  . Smoking status: Former Smoker    Years: 10.00    Types: Cigarettes    Last attempt to quit: 01/15/2015    Years since quitting: 2.8  . Smokeless tobacco: Never Used  Substance Use Topics  . Alcohol use: Yes    Comment: occasionally  . Drug use: No     Allergies  Sulfa antibiotics   Review of Systems Review of Systems  HENT: Negative for rhinorrhea.   Respiratory: Positive for cough.   All other systems reviewed and are negative.    Physical Exam Updated Vital Signs BP 118/61 (BP Location: Right Arm)   Pulse 89   Temp 98 F (36.7 C) (Oral)   Resp 16   Ht 5' (1.524 m)   Wt 81.6 kg   LMP 11/19/2017   SpO2 97%   BMI 35.15 kg/m   Physical Exam  Constitutional: She is oriented to person, place, and time. She appears well-developed and well-nourished.  HENT:  Head: Normocephalic.  Right Ear: External ear normal.  Left Ear: External ear normal.  Nose: Nose normal.  Mouth/Throat:  Oropharynx is clear and moist.  Eyes: Pupils are equal, round, and reactive to light. EOM are normal.  Neck: Normal range of motion.  Cardiovascular: Normal rate.  Pulmonary/Chest: Effort normal.  Abdominal: She exhibits no distension.  Musculoskeletal: Normal range of motion.  Neurological: She is alert and oriented to person, place, and time.  Skin: Skin is warm.  Psychiatric: She has a normal mood and affect.  Nursing note and vitals reviewed.    ED Treatments / Results  Labs (all labs ordered are listed, but only abnormal results are displayed) Labs Reviewed  URINALYSIS, ROUTINE W REFLEX MICROSCOPIC - Abnormal; Notable for the following components:      Result Value   APPearance CLOUDY (*)    Hgb urine dipstick SMALL (*)    Protein, ur 30 (*)    Nitrite POSITIVE (*)    Leukocytes, UA MODERATE (*)    Bacteria, UA RARE (*)    Squamous Epithelial / LPF >50 (*)    Non Squamous Epithelial 0-5 (*)    All other components within normal limits    EKG None  Radiology Dg Chest 2 View  Result Date: 11/26/2017 CLINICAL DATA:  Cough and fever EXAM: CHEST - 2 VIEW COMPARISON:  October 23, 2017 FINDINGS: Lungs are clear. Heart size and pulmonary vascularity are normal. No adenopathy. No bone lesions. IMPRESSION: No edema or consolidation. Electronically Signed   By: Bretta Bang III M.D.   On: 11/26/2017 11:38    Procedures Procedures (including critical care time)  Medications Ordered in ED Medications - No data to display   Initial Impression / Assessment and Plan / ED Course  I have reviewed the triage vital signs and the nursing notes.  Pertinent labs & imaging results that were available during my care of the patient were reviewed by me and considered in my medical decision making (see chart for details).     MDM  Chest xray no pneumonia,  Urine shows wbc's.  Pt given rx for keflex  Final Clinical Impressions(s) / ED Diagnoses   Final diagnoses:  Acute  cystitis with hematuria  Viral upper respiratory tract infection    ED Discharge Orders         Ordered    cephALEXin (KEFLEX) 500 MG capsule  4 times daily     11/26/17 1312        An After Visit Summary was printed and given to the patient.    Elson Areas, PA-C 11/26/17 1438    Samuel Jester, DO 11/26/17 1559

## 2017-11-26 NOTE — ED Triage Notes (Signed)
Pt has been coughing since Saturday. Fever on Sunday was 101. Also states it has been burning with urination as well as not feeling like shes emptying it.

## 2018-03-03 ENCOUNTER — Encounter (HOSPITAL_COMMUNITY): Payer: Self-pay | Admitting: Emergency Medicine

## 2018-03-03 ENCOUNTER — Emergency Department (HOSPITAL_COMMUNITY)
Admission: EM | Admit: 2018-03-03 | Discharge: 2018-03-03 | Disposition: A | Discharge status: Home / Self Care | Payer: Self-pay | Attending: Emergency Medicine | Admitting: Emergency Medicine

## 2018-03-03 ENCOUNTER — Other Ambulatory Visit: Payer: Self-pay

## 2018-03-03 DIAGNOSIS — Z79899 Other long term (current) drug therapy: Secondary | ICD-10-CM | POA: Insufficient documentation

## 2018-03-03 DIAGNOSIS — Z87891 Personal history of nicotine dependence: Secondary | ICD-10-CM | POA: Insufficient documentation

## 2018-03-03 DIAGNOSIS — Z760 Encounter for issue of repeat prescription: Secondary | ICD-10-CM | POA: Insufficient documentation

## 2018-03-03 DIAGNOSIS — A6000 Herpesviral infection of urogenital system, unspecified: Secondary | ICD-10-CM

## 2018-03-03 DIAGNOSIS — B009 Herpesviral infection, unspecified: Secondary | ICD-10-CM | POA: Insufficient documentation

## 2018-03-03 MED ORDER — ACYCLOVIR 400 MG PO TABS: 400.0000 mg | ORAL_TABLET | Freq: Three times a day (TID) | ORAL | 0 refills | Status: DC

## 2018-03-03 NOTE — ED Triage Notes (Signed)
Patient complains of herpes outbreak that started yesterday. Patient states she usually goes to health department but could not make it there this time.

## 2018-03-03 NOTE — Discharge Instructions (Signed)
(!)   129/57 Your vital signs have been reviewed.  Your pulse oximetry is 100% on room air.  Within normal limits by my interpretation.  A prescription has been written for your acyclovir.  Please follow-up with the health department as soon as possible so that they may continue your continuity of care.  Please see your primary physician or return to the emergency department if any high fever, signs of infection at the genital area, excessive nausea or vomiting, changes in condition, problems, or concerns.

## 2018-03-03 NOTE — ED Provider Notes (Signed)
[facility] EMERGENCY DEPARTMENT Provider Note   CSN: [id number] Arrival date & time: 03/03/18  1430    History   Chief Complaint Chief Complaint  Patient presents with  . Herpes Outbreak    HPI Kelli Barker is a 28 y.o. female.     The history is provided by the patient.  Medication Refill  Medications/supplies requested:  Acyclovir Reason for request:  Medications ran out (Pt unable to get to the Health Dept at this time) Medications taken before: yes - see home medications   Patient has complete original prescription information: yes     Past Medical History:  Diagnosis Date  . Anxiety   . Depression   . Herpes genitalia     Patient Active Problem List   Diagnosis Date Noted  . Left knee pain 03/01/2015  . Anxiety state 03/01/2015  . Obesity, unspecified 03/01/2015  . Cigarette nicotine dependence, uncomplicated 03/01/2015    Past Surgical History:  Procedure Laterality Date  . CHOLECYSTECTOMY  08-2008     OB History    Gravida  1   Para      Term      Preterm      AB      Living        SAB      TAB      Ectopic      Multiple      Live Births               Home Medications    Prior to Admission medications   Medication Sig Start Date End Date Taking? Authorizing Provider  acyclovir (ZOVIRAX) 400 MG tablet Take 1 tablet (400 mg total) by mouth 3 (three) times daily. 05/01/17   Ivery Quale, PA-C  albuterol (PROVENTIL HFA;VENTOLIN HFA) 108 (90 Base) MCG/ACT inhaler Inhale 2 puffs every 4 (four) hours as needed into the lungs for wheezing or shortness of breath. Patient not taking: Reported on 04/25/2017 11/29/16   Eber Hong, MD  benzonatate (TESSALON) 200 MG capsule Take 1 capsule (200 mg total) by mouth 3 (three) times daily as needed for cough. Swallow whole, do not chew 10/23/17   Triplett, Tammy, PA-C  cephALEXin (KEFLEX) 500 MG capsule Take 1 capsule (500 mg total) by mouth 4 (four) times daily. 11/26/17   Elson Areas, PA-C  famotidine (PEPCID) 20 MG tablet Take 1 tablet (20 mg total) by mouth 2 (two) times daily. 04/25/17   Bethann Berkshire, MD  HYDROcodone-acetaminophen (NORCO/VICODIN) 5-325 MG tablet Take one tab po q 4 hrs prn pain Patient not taking: Reported on 04/25/2017 03/06/17   Triplett, Tammy, PA-C  ibuprofen (ADVIL,MOTRIN) 800 MG tablet Take 1 tablet (800 mg total) by mouth 3 (three) times daily. 07/10/17   Triplett, Tammy, PA-C  ondansetron (ZOFRAN ODT) 4 MG disintegrating tablet 4mg  ODT q4 hours prn nausea/vomit 04/25/17   Bethann Berkshire, MD  predniSONE (DELTASONE) 20 MG tablet Take 2 tablets (40 mg total) by mouth daily. 10/23/17   Triplett, Tammy, PA-C  traMADol (ULTRAM) 50 MG tablet Take 1 tablet (50 mg total) by mouth every 6 (six) hours as needed. 04/25/17   Bethann Berkshire, MD    Family History Family History  Problem Relation Age of Onset  . COPD Father   . Hypertension Maternal Grandfather     Social History Social History   Tobacco Use  . Smoking status: Former Smoker    Years: 10.00    Types: Cigarettes    Last attempt  to quit: 01/15/2015    Years since quitting: 3.1  . Smokeless tobacco: Never Used  Substance Use Topics  . Alcohol use: Yes    Comment: occasionally  . Drug use: No     Allergies   Sulfa antibiotics   Review of Systems Review of Systems  Constitutional: Negative for activity change.       All ROS Neg except as noted in HPI  HENT: Negative for nosebleeds.   Eyes: Negative for photophobia and discharge.  Respiratory: Negative for cough, shortness of breath and wheezing.   Cardiovascular: Negative for chest pain and palpitations.  Gastrointestinal: Negative for abdominal pain and blood in stool.  Genitourinary: Negative for dysuria, frequency and hematuria.  Musculoskeletal: Negative for arthralgias, back pain and neck pain.  Skin: Positive for rash.       Herpes outbreak  Neurological: Negative for dizziness, seizures and speech difficulty.    Psychiatric/Behavioral: Negative for confusion and hallucinations.     Physical Exam Updated Vital Signs BP (!) 129/57 (BP Location: Right Arm)   Pulse 94   Temp 97.9 F (36.6 C) (Oral)   Resp 16   Ht 5\' 2"  (1.575 m)   Wt 99.8 kg   LMP 11/19/2017   SpO2 100%   BMI 40.24 kg/m   Physical Exam Vitals signs and nursing note reviewed.  Constitutional:      Appearance: She is well-developed. She is not toxic-appearing.  HENT:     Head: Normocephalic.     Right Ear: Tympanic membrane and external ear normal.     Left Ear: Tympanic membrane and external ear normal.     Mouth/Throat:     Comments: No oral or mucosal lesions Eyes:     General: Lids are normal.     Pupils: Pupils are equal, round, and reactive to light.  Neck:     Musculoskeletal: Normal range of motion and neck supple.     Vascular: No carotid bruit.  Cardiovascular:     Rate and Rhythm: Normal rate and regular rhythm.     Pulses: Normal pulses.     Heart sounds: Normal heart sounds.  Pulmonary:     Effort: No respiratory distress.     Breath sounds: Normal breath sounds.  Abdominal:     General: Bowel sounds are normal.     Palpations: Abdomen is soft.     Tenderness: There is no abdominal tenderness. There is no guarding.  Genitourinary:    Comments: Pt describes patches of blisters some open similar to previous herpes breakouts. Musculoskeletal: Normal range of motion.  Lymphadenopathy:     Head:     Right side of head: No submandibular adenopathy.     Left side of head: No submandibular adenopathy.     Cervical: No cervical adenopathy.  Skin:    General: Skin is warm and dry.  Neurological:     Mental Status: She is alert and oriented to person, place, and time.     Cranial Nerves: No cranial nerve deficit.     Sensory: No sensory deficit.  Psychiatric:        Speech: Speech normal.      ED Treatments / Results  Labs (all labs ordered are listed, but only abnormal results are  displayed) Labs Reviewed - No data to display  EKG None  Radiology No results found.  Procedures Procedures (including critical care time)  Medications Ordered in ED Medications - No data to display   Initial Impression / Assessment and  Plan / ED Course  I have reviewed the triage vital signs and the nursing notes.  Pertinent labs & imaging results that were available during my care of the patient were reviewed by me and considered in my medical decision making (see chart for details).         Final Clinical Impressions(s) / ED Diagnoses MDM  Vital signs reviewed.  Patient describes patches of blisters in the perineal area similar to previous herpes breakouts.  She says some of them are open.  She denies fever or chills, nausea or vomiting.  Prescription for acyclovir given to the patient.  Have asked her to see her primary physician at the health department as soon as possible for continuity of care.  Patient is in agreement with this plan.   Final diagnoses:  Medication refill  Genital herpes simplex, unspecified site    ED Discharge Orders         Ordered    acyclovir (ZOVIRAX) 400 MG tablet  3 times daily     03/03/18 1641           Ivery QualeBryant, Halyn Flaugher, PA-C 03/03/18 1645    Jacalyn LefevreHaviland, Julie, MD 03/03/18 1845

## 2018-05-15 ENCOUNTER — Encounter: Payer: Self-pay | Admitting: Adult Health

## 2018-05-15 ENCOUNTER — Ambulatory Visit (INDEPENDENT_AMBULATORY_CARE_PROVIDER_SITE_OTHER): Payer: Medicaid Other | Admitting: Adult Health

## 2018-05-15 ENCOUNTER — Other Ambulatory Visit: Payer: Self-pay

## 2018-05-15 DIAGNOSIS — Z3201 Encounter for pregnancy test, result positive: Secondary | ICD-10-CM | POA: Insufficient documentation

## 2018-05-15 DIAGNOSIS — O3680X0 Pregnancy with inconclusive fetal viability, not applicable or unspecified: Secondary | ICD-10-CM | POA: Diagnosis not present

## 2018-05-15 DIAGNOSIS — Z309 Encounter for contraceptive management, unspecified: Secondary | ICD-10-CM | POA: Insufficient documentation

## 2018-05-15 DIAGNOSIS — O0930 Supervision of pregnancy with insufficient antenatal care, unspecified trimester: Secondary | ICD-10-CM | POA: Insufficient documentation

## 2018-05-15 DIAGNOSIS — Z3490 Encounter for supervision of normal pregnancy, unspecified, unspecified trimester: Secondary | ICD-10-CM | POA: Insufficient documentation

## 2018-05-15 NOTE — Progress Notes (Signed)
Patient ID: Kelli Barker, female   DOB: [birth year], 28 y.o.   MRN: [id number]   TELEHEALTH VIRTUAL GYNECOLOGY VISIT ENCOUNTER NOTE  I connected with Kelli Barker on 05/15/18 at 11:45 AM EDT by telephone at home and verified that I am speaking with the correct person using two identifiers.   I discussed the limitations, risks, security and privacy concerns of performing an evaluation and management service by telephone and the availability of in person appointments. I also discussed with the patient that there may be a patient responsible charge related to this service. The patient expressed understanding and agreed to proceed.   History:  Kelli Barker is a 28 y.o. [phone] female being evaluated today for +HPT last month, has missed several periods, but has had irregular periods for years.So about 27 weeks by LMP of 11/19/17, with EDD 08/14/2018. She has +FM now.  Her partner started chemotherapy today for stage 4 lymphoma. Her fist child had health issues and was adopted out.  She denies any abnormal vaginal discharge, bleeding, pelvic pain or other concerns.       Past Medical History:  Diagnosis Date  . Anxiety   . Depression   . Herpes genitalia    Past Surgical History:  Procedure Laterality Date  . CHOLECYSTECTOMY  08-2008   The following portions of the patient's history were reviewed and updated as appropriate: allergies, current medications, past family history, past medical history, past social history, past surgical history and problem list.   Health Maintenance: [facility]  Review of Systems:  Pertinent items noted in HPI and remainder of comprehensive ROS otherwise negative.  Physical Exam:   General:  Alert, oriented and cooperative.   Mental Status: Normal mood and affect perceived. Normal judgment and thought content.  Physical exam deferred due to nature of the encounter LMP 11/19/2017 (Within Days) per pt. Fall risk is low. PHQ 2 score 0.   Labs  and Imaging No results found for this or any previous visit (from the past 336 hour(s)). No results found.    Assessment and Plan:     1. Positive pregnancy test -+HPT  2. Pregnancy, unspecified gestational age -continue OTC PNV and add folic acid   3. Encounter to determine fetal viability of pregnancy, single or unspecified fetus - US OB Comp + 14 Wk; Future, in 1 week maybe anatomy too  4. Late prenatal care affecting pregnancy, antepartum       I discussed the assessment and treatment plan with the patient. The patient was provided an opportunity to ask questions and all were answered. The patient agreed with the plan and demonstrated an understanding of the instructions.   The patient was advised to call back or seek an in-person evaluation/go to the ED if the symptoms worsen or if the condition fails to improve as anticipated.  I provided 10 minutes of non-face-to-face time during this encounter.   Cyril Mourning, NP [facility] [employer], [facility]

## 2018-05-19 ENCOUNTER — Other Ambulatory Visit: Payer: Self-pay

## 2018-05-19 ENCOUNTER — Ambulatory Visit (INDEPENDENT_AMBULATORY_CARE_PROVIDER_SITE_OTHER): Payer: Medicaid Other

## 2018-05-19 ENCOUNTER — Other Ambulatory Visit: Payer: Self-pay | Admitting: Adult Health

## 2018-05-19 DIAGNOSIS — Z363 Encounter for antenatal screening for malformations: Secondary | ICD-10-CM

## 2018-05-19 DIAGNOSIS — O3680X0 Pregnancy with inconclusive fetal viability, not applicable or unspecified: Secondary | ICD-10-CM

## 2018-05-19 NOTE — Progress Notes (Addendum)
[place] 25+6 wks,breech,cx 6.2 cm,anterior placenta gr 0,normal ovaries bilat,afi 17 cm,fhr 154 bpm,anatomy complete,no obvious abnormalities,EFW 1229 g 99.8%,EDD by LMP 08/26/2018

## 2018-05-29 ENCOUNTER — Other Ambulatory Visit (HOSPITAL_COMMUNITY)
Admission: RE | Admit: 2018-05-29 | Discharge: 2018-05-29 | Disposition: A | Discharge status: Home / Self Care | Payer: Medicaid Other | Source: Ambulatory Visit | Attending: Obstetrics & Gynecology | Admitting: Obstetrics & Gynecology

## 2018-05-29 ENCOUNTER — Ambulatory Visit: Payer: Medicaid Other | Admitting: *Deleted

## 2018-05-29 ENCOUNTER — Encounter: Payer: Self-pay | Admitting: Women's Health

## 2018-05-29 ENCOUNTER — Other Ambulatory Visit: Payer: Self-pay

## 2018-05-29 ENCOUNTER — Ambulatory Visit (INDEPENDENT_AMBULATORY_CARE_PROVIDER_SITE_OTHER): Payer: Medicaid Other | Admitting: Women's Health

## 2018-05-29 VITALS — BP 139/80 | HR 91 | Temp 98.2°F | Wt 258.0 lb

## 2018-05-29 DIAGNOSIS — Z3482 Encounter for supervision of other normal pregnancy, second trimester: Secondary | ICD-10-CM | POA: Diagnosis not present

## 2018-05-29 DIAGNOSIS — Z3A27 27 weeks gestation of pregnancy: Secondary | ICD-10-CM | POA: Diagnosis not present

## 2018-05-29 DIAGNOSIS — Z124 Encounter for screening for malignant neoplasm of cervix: Secondary | ICD-10-CM | POA: Insufficient documentation

## 2018-05-29 DIAGNOSIS — Z1379 Encounter for other screening for genetic and chromosomal anomalies: Secondary | ICD-10-CM

## 2018-05-29 DIAGNOSIS — O0932 Supervision of pregnancy with insufficient antenatal care, second trimester: Secondary | ICD-10-CM | POA: Diagnosis not present

## 2018-05-29 DIAGNOSIS — Z1371 Encounter for nonprocreative screening for genetic disease carrier status: Secondary | ICD-10-CM

## 2018-05-29 DIAGNOSIS — Z331 Pregnant state, incidental: Secondary | ICD-10-CM

## 2018-05-29 DIAGNOSIS — F418 Other specified anxiety disorders: Secondary | ICD-10-CM

## 2018-05-29 DIAGNOSIS — Z1389 Encounter for screening for other disorder: Secondary | ICD-10-CM

## 2018-05-29 DIAGNOSIS — Z3490 Encounter for supervision of normal pregnancy, unspecified, unspecified trimester: Secondary | ICD-10-CM | POA: Insufficient documentation

## 2018-05-29 DIAGNOSIS — Z23 Encounter for immunization: Secondary | ICD-10-CM

## 2018-05-29 DIAGNOSIS — Z3483 Encounter for supervision of other normal pregnancy, third trimester: Secondary | ICD-10-CM

## 2018-05-29 LAB — POCT URINALYSIS DIPSTICK OB
Blood, UA: NEGATIVE
Glucose, UA: NEGATIVE
Ketones, UA: NEGATIVE
Nitrite, UA: NEGATIVE
POC,PROTEIN,UA: NEGATIVE

## 2018-05-29 MED ORDER — BLOOD PRESSURE MONITOR MISC: 0 refills | Status: DC

## 2018-05-29 MED ORDER — PANTOPRAZOLE SODIUM 20 MG PO TBEC: 20.0000 mg | DELAYED_RELEASE_TABLET | Freq: Every day | ORAL | 3 refills | Status: DC

## 2018-05-29 NOTE — Progress Notes (Signed)
INITIAL OBSTETRICAL VISIT Patient name: Kelli Barker MRN [id number]  Date of birth: Apr 18, 1990 Chief Complaint:   Initial Prenatal Visit (heartburn, "digestive issues")  History of Present Illness:   Kelli Barker is a 28 y.o. G58P4004 Caucasian female at [redacted]w[redacted]d by 27wk u/s which was exactly 14d off from LMP- which was uncertain w/in a few days, so per [facility] go w/ u/s, with an Estimated Date of Delivery: 08/26/18 being seen today for her initial obstetrical visit.   Her obstetrical history is significant for term uncomplicated SVB x 4, LGA x 2, no GDM.   Today she reports bad heartburn, tums doesn't help. Had to be on another medicine for digestive issues during last pregnancy, can't remember name.  Patient's last menstrual period was 11/19/2017 (within days). Last pap >52yrs ago. Results were: normal Review of Systems:   Pertinent items are noted in HPI Denies cramping/contractions, leakage of fluid, vaginal bleeding, abnormal vaginal discharge w/ itching/odor/irritation, headaches, visual changes, shortness of breath, chest pain, abdominal pain, severe nausea/vomiting, or problems with urination or bowel movements unless otherwise stated above.  Pertinent History Reviewed:  Reviewed past medical,surgical, social, obstetrical and family history.  Reviewed problem list, medications and allergies. OB History  Gravida Para Term Preterm AB Living  5 4 4     4   SAB TAB Ectopic Multiple Live Births          4    # Outcome Date GA Lbr Len/2nd Weight Sex Delivery Anes PTL Lv  5 Current           4 Term 09/07/13 [redacted]w[redacted]d  9 lb 2 oz (4.139 kg) F Vag-Spont EPI N LIV  3 Term 05/08/11 [redacted]w[redacted]d  7 lb 15 oz (3.6 kg) M Vag-Spont EPI N LIV  2 Term 03/06/10 [redacted]w[redacted]d  9 lb 8 oz (4.309 kg) M Vag-Spont EPI  LIV  1 Term 09/06/06 [redacted]w[redacted]d  5 lb 4 oz (2.381 kg) F Vag-Spont EPI N LIV   Physical Assessment:   Vitals:   05/29/18 0957  BP: 139/80  Pulse: 91  Temp: 98.2 F (36.8 C)  Weight: 258 lb (117 kg)  Body  mass index is 47.19 kg/m.       Physical Examination:  General appearance - well appearing, and in no distress  Mental status - alert, oriented to person, place, and time  Psych:  She has a normal mood and affect  Skin - warm and dry, normal color, no suspicious lesions noted  Chest - effort normal, all lung fields clear to auscultation bilaterally  Heart - normal rate and regular rhythm  Abdomen - soft, nontender  Extremities:  No swelling or varicosities noted  Pelvic - VULVA: normal appearing vulva with no masses, tenderness or lesions  VAGINA: normal appearing vagina with normal color and discharge, no lesions  CERVIX: normal appearing cervix without discharge or lesions, no CMT  Thin prep pap is done w/ reflex HR HPV cotesting  Fetal Heart Rate (bpm): 156 via doppler FH: 30cm  Results for orders placed or performed in visit on 05/29/18 (from the past 24 hour(s))  POC Urinalysis Dipstick OB   Collection Time: 05/29/18 10:19 AM  Result Value Ref Range   Color, UA     Clarity, UA     Glucose, UA Negative Negative   Bilirubin, UA     Ketones, UA neg    Spec Grav, UA     Blood, UA neg    pH, UA     POC,PROTEIN,UA Negative  Negative, Trace, Small (1+), Moderate (2+), Large (3+), 4+   Urobilinogen, UA     Nitrite, UA neg    Leukocytes, UA Moderate (2+) (A) Negative   Appearance     Odor      Assessment & Plan:  1) Low-Risk Pregnancy G5P4004 at 8322w2d with an Estimated Date of Delivery: 08/26/18   2) Initial OB visit  3) Late care  4) Heartburn> rx protonix, other digestive med possibly reglan, let us know if feels like she needs it  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 pantoprazole (PROTONIX) 20 MG tablet    Sig: Take 1 tablet (20 mg total) by mouth daily.    Dispense:  30 tablet    Refill:  3    Order Specific Question:   Supervising Provider    Answer:   Despina HiddenEURE, LUTHER H  [[id number]]  . Blood Pressure Monitor MISC    Sig: For regular home bp monitoring during pregnancy    Dispense:  1 each    Refill:  0    Dx: z34.90    Order Specific Question:   Supervising Provider    Answer:   Lazaro ArmsEURE, LUTHER H [[id number]]    Initial labs obtained Continue prenatal vitamins Reviewed n/v relief measures and warning s/s to report Reviewed recommended weight gain based on pre-gravid BMI Encouraged well-balanced diet Genetic Screening discussed: requested maternit21 Cystic fibrosis, SMA, Fragile X screening discussed requested Ultrasound discussed; fetal survey: results reviewed CCNC completed>PCM not here, form faxed Does not have home bp cuff. Rx faxed to [facility]. Check bp weekly, let us know if >140/90.   tdap today  Follow-up: Return for asap sugar test only (no visit), then 3wks for LROB webex, Sign BTL consent today.   Orders Placed This Encounter  Procedures  . Urine Culture  . Tdap vaccine greater than or equal to 7yo IM  . Obstetric Panel, Including HIV  . Urinalysis, Routine w reflex microscopic  . Sickle cell screen  . Pain Management Screening Profile (10S)  . MaterniT 21 plus Core, Blood  . Inheritest Core(CF97,SMA,FraX)  . POC Urinalysis Dipstick OB    Cheral MarkerKimberly R Glenn Gullickson CNM, [facility]tr[facility]NP-BC 05/29/2018 11:03 AM

## 2018-05-29 NOTE — Patient Instructions (Signed)
Kelli Barker, I greatly value your feedback.  If you receive a survey following your visit with [place] today, we appreciate you taking the time to fill it out.  Thanks, Kelli Barker, CNM, WHNP-BC   You will have your sugar test next visit.  Please do not eat or drink anything after midnight the night before you come, not even water.  You will be here for at least two hours.     [facility] HAS MOVED!!! It is now [facility] ([address] [place], [place] [address]) Entrance located off of [address] Free 24/7 valet parking   Home Blood Pressure Monitoring for Patients   Your provider has recommended that you check your blood pressure (BP) at least once a week at home. If you do not have a blood pressure cuff at home, one will be provided for you. Contact your provider if you have not received your monitor within 1 week.   Helpful Tips for Accurate Home Blood Pressure Checks  . Don't smoke, exercise, or drink caffeine 30 minutes before checking your BP . Use the restroom before checking your BP (a full bladder can raise your pressure) . Relax in a comfortable upright chair . Feet on the ground . Left arm resting comfortably on a flat surface at the level of your heart . Legs uncrossed . Back supported . Sit quietly and don't talk . Place the cuff on your bare arm . Adjust snuggly, so that only two fingertips can fit between your skin and the top of the cuff . Check 2 readings separated by at least one minute . Keep a log of your BP readings . For a visual, please reference this diagram: http://ccnc.care/bpdiagram  Provider Name: [facility]     Phone: [phone]  Zone 1: ALL CLEAR  Continue to monitor your symptoms:  . BP reading is less than 140 (top number) or less than 90 (bottom number)  . No right upper stomach pain . No headaches or seeing spots . No feeling nauseated or throwing up . No swelling in face and hands  Zone 2:  CAUTION Call your doctor's office for any of the following:  . BP reading is greater than 140 (top number) or greater than 90 (bottom number)  . Stomach pain under your ribs in the middle or right side . Headaches or seeing spots . Feeling nauseated or throwing up . Swelling in face and hands  Zone 3: EMERGENCY  Seek immediate medical care if you have any of the following:  . BP reading is greater than160 (top number) or greater than 110 (bottom number) . Severe headaches not improving with Tylenol . Serious difficulty catching your breath . Any worsening symptoms from Zone 2    Call the office [phone]) or go to [facility] if:  You begin to have strong, frequent contractions  Your water breaks.  Sometimes it is a big gush of fluid, sometimes it is just a trickle that keeps getting your panties wet or running down your legs  You have vaginal bleeding.  It is normal to have a small amount of spotting if your cervix was checked.   You don't feel your baby moving like normal.  If you don't, get you something to eat and drink and lay down and focus on feeling your baby move.   If your baby is still not moving like normal, you should call the office or go to [facility].  Second Trimester of Pregnancy The second trimester is from week 13 through week 28, months 4 through 6. The second trimester is often a time when you feel your best. Your body has also adjusted to being pregnant, and you begin to feel better physically. Usually, morning sickness has lessened or quit completely, you may have more energy, and you may have an increase in appetite. The second trimester is also a time when the fetus is growing rapidly. At the end of the sixth month, the fetus is about 9 inches long and weighs about 1 pounds. You will likely begin to feel the baby move (quickening) between 18 and 20 weeks of the pregnancy. BODY CHANGES Your body goes through many changes during pregnancy. The changes  vary from woman to woman.   Your weight will continue to increase. You will notice your lower abdomen bulging out.  You may begin to get stretch marks on your hips, abdomen, and breasts.  You may develop headaches that can be relieved by medicines approved by your health care provider.  You may urinate more often because the fetus is pressing on your bladder.  You may develop or continue to have heartburn as a result of your pregnancy.  You may develop constipation because certain hormones are causing the muscles that push waste through your intestines to slow down.  You may develop hemorrhoids or swollen, bulging veins (varicose veins).  You may have back pain because of the weight gain and pregnancy hormones relaxing your joints between the bones in your pelvis and as a result of a shift in weight and the muscles that support your balance.  Your breasts will continue to grow and be tender.  Your gums may bleed and may be sensitive to brushing and flossing.  Dark spots or blotches (chloasma, mask of pregnancy) may develop on your face. This will likely fade after the baby is born.  A dark line from your belly button to the pubic area (linea nigra) may appear. This will likely fade after the baby is born.  You may have changes in your hair. These can include thickening of your hair, rapid growth, and changes in texture. Some women also have hair loss during or after pregnancy, or hair that feels dry or thin. Your hair will most likely return to normal after your baby is born. WHAT TO EXPECT AT YOUR PRENATAL VISITS During a routine prenatal visit:  You will be weighed to make sure you and the fetus are growing normally.  Your blood pressure will be taken.  Your abdomen will be measured to track your baby's growth.  The fetal heartbeat will be listened to.  Any test results from the previous visit will be discussed. Your health care provider may ask you:  How you are  feeling.  If you are feeling the baby move.  If you have had any abnormal symptoms, such as leaking fluid, bleeding, severe headaches, or abdominal cramping.  If you have any questions. Other tests that may be performed during your second trimester include:  Blood tests that check for:  Low iron levels (anemia).  Gestational diabetes (between 24 and 28 weeks).  Rh antibodies.  Urine tests to check for infections, diabetes, or protein in the urine.  An ultrasound to confirm the proper growth and development of the baby.  An amniocentesis to check for possible genetic problems.  Fetal screens for spina bifida and Down syndrome. HOME CARE INSTRUCTIONS   Avoid all smoking, herbs, alcohol, and  unprescribed drugs. These chemicals affect the formation and growth of the baby.  Follow your health care provider's instructions regarding medicine use. There are medicines that are either safe or unsafe to take during pregnancy.  Exercise only as directed by your health care provider. Experiencing uterine cramps is a good sign to stop exercising.  Continue to eat regular, healthy meals.  Wear a good support bra for breast tenderness.  Do not use hot tubs, steam rooms, or saunas.  Wear your seat belt at all times when driving.  Avoid raw meat, uncooked cheese, cat litter boxes, and soil used by cats. These carry germs that can cause birth defects in the baby.  Take your prenatal vitamins.  Try taking a stool softener (if your health care provider approves) if you develop constipation. Eat more high-fiber foods, such as fresh vegetables or fruit and whole grains. Drink plenty of fluids to keep your urine clear or pale yellow.  Take warm sitz baths to soothe any pain or discomfort caused by hemorrhoids. Use hemorrhoid cream if your health care provider approves.  If you develop varicose veins, wear support hose. Elevate your feet for 15 minutes, 3-4 times a day. Limit salt in your  diet.  Avoid heavy lifting, wear low heel shoes, and practice good posture.  Rest with your legs elevated if you have leg cramps or low back pain.  Visit your dentist if you have not gone yet during your pregnancy. Use a soft toothbrush to brush your teeth and be gentle when you floss.  A sexual relationship may be continued unless your health care provider directs you otherwise.  Continue to go to all your prenatal visits as directed by your health care provider. SEEK MEDICAL CARE IF:   You have dizziness.  You have mild pelvic cramps, pelvic pressure, or nagging pain in the abdominal area.  You have persistent nausea, vomiting, or diarrhea.  You have a bad smelling vaginal discharge.  You have pain with urination. SEEK IMMEDIATE MEDICAL CARE IF:   You have a fever.  You are leaking fluid from your vagina.  You have spotting or bleeding from your vagina.  You have severe abdominal cramping or pain.  You have rapid weight gain or loss.  You have shortness of breath with chest pain.  You notice sudden or extreme swelling of your face, hands, ankles, feet, or legs.  You have not felt your baby move in over an hour.  You have severe headaches that do not go away with medicine.  You have vision changes. Document Released: 12/25/2000 Document Revised: 01/05/2013 Document Reviewed: 03/03/2012 [facility] Patient Information 2015 [place], [place]. This information is not intended to replace advice given to you by your health care provider. Make sure you discuss any questions you have with your health care provider.  Coronavirus (COVID-19) Are you at risk?  Are you at risk for the Coronavirus (COVID-19)?  To be considered HIGH RISK for Coronavirus (COVID-19), you have to meet the following criteria:  . Traveled to [place], [place], [place], [place] or [place]; or in the [place] to [place], [place], [place], or [place]; and have fever, cough, and shortness of breath  within the last 2 weeks of travel OR . Been in close contact with a person diagnosed with COVID-19 within the last 2 weeks and have fever, cough, and shortness of breath . IF YOU DO NOT MEET THESE CRITERIA, YOU ARE CONSIDERED LOW RISK FOR COVID-19.  What to do if  you are HIGH RISK for COVID-19?  Marland Kitchen. If you are having a medical emergency, call 911. . Seek medical care right away. Before you go to a doctor's office, urgent care or emergency department, call ahead and tell them about your recent travel, contact with someone diagnosed with COVID-19, and your symptoms. You should receive instructions from your physician's office regarding next steps of care.  . When you arrive at healthcare provider, tell the healthcare staff immediately you have returned from visiting [place], [place], [place], [place] or [place]; or traveled in the [place] to [place], [place], [place], or [place]; in the last two weeks or you have been in close contact with a person diagnosed with COVID-19 in the last 2 weeks.   . Tell the health care staff about your symptoms: fever, cough and shortness of breath. . After you have been seen by a medical provider, you will be either: o Tested for (COVID-19) and discharged home on quarantine except to seek medical care if symptoms worsen, and asked to  - Stay home and avoid contact with others until you get your results (4-5 days)  - Avoid travel on public transportation if possible (such as bus, train, or airplane) or o Sent to the Emergency Department by EMS for evaluation, COVID-19 testing, and possible admission depending on your condition and test results.  What to do if you are LOW RISK for COVID-19?  Reduce your risk of any infection by using the same precautions used for avoiding the common cold or flu:  Marland Kitchen. Wash your hands often with soap and warm water for at least 20 seconds.  If soap and water are not readily available, use an alcohol-based hand sanitizer with at  least 60% alcohol.  . If coughing or sneezing, cover your mouth and nose by coughing or sneezing into the elbow areas of your shirt or coat, into a tissue or into your sleeve (not your hands). . Avoid shaking hands with others and consider head nods or verbal greetings only. . Avoid touching your eyes, nose, or mouth with unwashed hands.  . Avoid close contact with people who are sick. . Avoid places or events with large numbers of people in one location, like concerts or sporting events. . Carefully consider travel plans you have or are making. . If you are planning any travel outside or inside the [place], visit the CDC's Travelers' Health webpage for the latest health notices. . If you have some symptoms but not all symptoms, continue to monitor at home and seek medical attention if your symptoms worsen. . If you are having a medical emergency, call 911.   ADDITIONAL HEALTHCARE OPTIONS FOR PATIENTS  [facility] / e-Visit: [email or web address]         [facility]: [phone]  [facility]: [phone]                   [facility]: [phone]

## 2018-05-30 LAB — OBSTETRIC PANEL, INCLUDING HIV
Antibody Screen: NEGATIVE
Basophils Absolute: 0 10*3/uL (ref 0.0–0.2)
Basos: 0 %
EOS (ABSOLUTE): 0.5 10*3/uL — ABNORMAL HIGH (ref 0.0–0.4)
Eos: 5 %
HIV Screen 4th Generation wRfx: NONREACTIVE
Hematocrit: 29.3 % — ABNORMAL LOW (ref 34.0–46.6)
Hemoglobin: 9.7 g/dL — ABNORMAL LOW (ref 11.1–15.9)
Hepatitis B Surface Ag: NEGATIVE
Immature Grans (Abs): 0.2 10*3/uL — ABNORMAL HIGH (ref 0.0–0.1)
Immature Granulocytes: 2 %
Lymphocytes Absolute: 1.2 10*3/uL (ref 0.7–3.1)
Lymphs: 13 %
MCH: 25.9 pg — ABNORMAL LOW (ref 26.6–33.0)
MCHC: 33.1 g/dL (ref 31.5–35.7)
MCV: 78 fL — ABNORMAL LOW (ref 79–97)
Monocytes Absolute: 0.7 10*3/uL (ref 0.1–0.9)
Monocytes: 7 %
Neutrophils Absolute: 6.8 10*3/uL (ref 1.4–7.0)
Neutrophils: 73 %
Platelets: 254 10*3/uL (ref 150–450)
RBC: 3.75 x10E6/uL — ABNORMAL LOW (ref 3.77–5.28)
RDW: 14.3 % (ref 11.7–15.4)
RPR Ser Ql: NONREACTIVE
Rh Factor: POSITIVE
Rubella Antibodies, IGG: 1.5 index (ref >0.99)
WBC: 9.4 10*3/uL (ref 3.4–10.8)

## 2018-05-30 LAB — URINALYSIS, ROUTINE W REFLEX MICROSCOPIC
Bilirubin, UA: NEGATIVE
Glucose, UA: NEGATIVE
Ketones, UA: NEGATIVE
Nitrite, UA: POSITIVE — AB
Protein,UA: NEGATIVE
RBC, UA: NEGATIVE
Specific Gravity, UA: 1.017 (ref 1.005–1.030)
Urobilinogen, Ur: 0.2 mg/dL (ref 0.2–1.0)
pH, UA: 6.5 (ref 5.0–7.5)

## 2018-05-30 LAB — MICROSCOPIC EXAMINATION: Casts: NONE SEEN /lpf

## 2018-05-30 LAB — INHERITEST CORE(CF97,SMA,FRAX)

## 2018-05-30 LAB — MED LIST OPTION NOT SELECTED

## 2018-05-30 LAB — SICKLE CELL SCREEN: Sickle Cell Screen: NEGATIVE

## 2018-05-31 LAB — URINE CULTURE

## 2018-06-01 ENCOUNTER — Other Ambulatory Visit: Payer: Self-pay | Admitting: Women's Health

## 2018-06-01 ENCOUNTER — Other Ambulatory Visit: Payer: Self-pay

## 2018-06-01 ENCOUNTER — Other Ambulatory Visit: Payer: Medicaid Other

## 2018-06-01 DIAGNOSIS — Z3A29 29 weeks gestation of pregnancy: Secondary | ICD-10-CM | POA: Diagnosis not present

## 2018-06-01 DIAGNOSIS — O99891 Other specified diseases and conditions complicating pregnancy: Secondary | ICD-10-CM

## 2018-06-01 DIAGNOSIS — Z3483 Encounter for supervision of other normal pregnancy, third trimester: Secondary | ICD-10-CM

## 2018-06-01 DIAGNOSIS — R8271 Bacteriuria: Secondary | ICD-10-CM | POA: Insufficient documentation

## 2018-06-01 LAB — PMP SCREEN PROFILE (10S), URINE
Amphetamine Scrn, Ur: NEGATIVE ng/mL
BARBITURATE SCREEN URINE: NEGATIVE ng/mL
BENZODIAZEPINE SCREEN, URINE: NEGATIVE ng/mL
CANNABINOIDS UR QL SCN: NEGATIVE ng/mL
Cocaine (Metab) Scrn, Ur: NEGATIVE ng/mL
Creatinine(Crt), U: 63.1 mg/dL (ref 20.0–300.0)
Methadone Screen, Urine: NEGATIVE ng/mL
OXYCODONE+OXYMORPHONE UR QL SCN: NEGATIVE ng/mL
Opiate Scrn, Ur: NEGATIVE ng/mL
Ph of Urine: 6.5 (ref 4.5–8.9)
Phencyclidine Qn, Ur: NEGATIVE ng/mL
Propoxyphene Scrn, Ur: NEGATIVE ng/mL

## 2018-06-01 MED ORDER — FERROUS SULFATE 325 (65 FE) MG PO TABS: 325.0000 mg | ORAL_TABLET | Freq: Two times a day (BID) | ORAL | 3 refills | Status: DC

## 2018-06-01 MED ORDER — NITROFURANTOIN MONOHYD MACRO 100 MG PO CAPS: 100.0000 mg | ORAL_CAPSULE | Freq: Two times a day (BID) | ORAL | 0 refills | Status: DC

## 2018-06-02 LAB — GLUCOSE TOLERANCE, 2 HOURS W/ 1HR
Glucose, 1 hour: 119 mg/dL (ref 65–179)
Glucose, 2 hour: 93 mg/dL (ref 65–152)
Glucose, Fasting: 88 mg/dL (ref 65–91)

## 2018-06-02 LAB — CYTOLOGY - PAP
Chlamydia: NEGATIVE
Diagnosis: NEGATIVE
Neisseria Gonorrhea: NEGATIVE

## 2018-06-02 LAB — HIV ANTIBODY (ROUTINE TESTING W REFLEX): HIV Screen 4th Generation wRfx: NONREACTIVE

## 2018-06-02 LAB — RPR: RPR Ser Ql: NONREACTIVE

## 2018-06-02 LAB — ANTIBODY SCREEN: Antibody Screen: NEGATIVE

## 2018-06-04 LAB — MATERNIT 21 PLUS CORE, BLOOD
Fetal Fraction: 8
Result (T21): NEGATIVE
Trisomy 13 (Patau syndrome): NEGATIVE
Trisomy 18 (Edwards syndrome): NEGATIVE
Trisomy 21 (Down syndrome): NEGATIVE

## 2018-06-05 ENCOUNTER — Other Ambulatory Visit: Payer: Self-pay | Admitting: Adult Health

## 2018-06-05 MED ORDER — METOCLOPRAMIDE HCL 10 MG PO TABS: 10.0000 mg | ORAL_TABLET | Freq: Three times a day (TID) | ORAL | 1 refills | Status: DC

## 2018-06-05 NOTE — Progress Notes (Signed)
Will rx reglan

## 2018-06-12 LAB — INHERITEST CORE(CF97,SMA,FRAX)

## 2018-06-16 ENCOUNTER — Telehealth: Payer: Self-pay | Admitting: *Deleted

## 2018-06-16 NOTE — Telephone Encounter (Signed)
Pt states that she has not received her blood pressure cuff yet and was wanting to see what she needed to do to get that.

## 2018-06-18 ENCOUNTER — Ambulatory Visit (INDEPENDENT_AMBULATORY_CARE_PROVIDER_SITE_OTHER): Payer: Medicaid Other | Admitting: Advanced Practice Midwife

## 2018-06-18 ENCOUNTER — Encounter: Payer: Self-pay | Admitting: Advanced Practice Midwife

## 2018-06-18 ENCOUNTER — Other Ambulatory Visit: Payer: Self-pay

## 2018-06-18 ENCOUNTER — Ambulatory Visit: Payer: Medicaid Other | Admitting: Advanced Practice Midwife

## 2018-06-18 VITALS — BP 123/74 | HR 108 | Wt 267.0 lb

## 2018-06-18 DIAGNOSIS — Z1389 Encounter for screening for other disorder: Secondary | ICD-10-CM | POA: Diagnosis not present

## 2018-06-18 DIAGNOSIS — Z331 Pregnant state, incidental: Secondary | ICD-10-CM

## 2018-06-18 DIAGNOSIS — Z3483 Encounter for supervision of other normal pregnancy, third trimester: Secondary | ICD-10-CM

## 2018-06-18 DIAGNOSIS — Z3A32 32 weeks gestation of pregnancy: Secondary | ICD-10-CM

## 2018-06-18 LAB — POCT URINALYSIS DIPSTICK OB
Blood, UA: NEGATIVE
Glucose, UA: NEGATIVE
Ketones, UA: NEGATIVE
Leukocytes, UA: NEGATIVE
Nitrite, UA: NEGATIVE

## 2018-06-18 NOTE — Progress Notes (Signed)
  [id number] [redacted]w[redacted]d Estimated Date of Delivery: 08/12/18  Blood pressure 123/74, pulse (!) 108, weight 267 lb (121.1 kg), last menstrual period 11/19/2017.   BP weight and urine results all reviewed and noted.  Please refer to the obstetrical flow sheet for the fundal height and fetal heart rate documentation:  Patient reports good fetal movement, denies any bleeding and no rupture of membranes symptoms or regular contractions. Patient is without complaints. All questions were answered.   Physical Assessment:   Vitals:   06/18/18 1401  BP: 123/74  Pulse: (!) 108  Weight: 267 lb (121.1 kg)  Body mass index is 48.83 kg/m.        Physical Examination:   General appearance: Well appearing, and in no distress  Mental status: Alert, oriented to person, place, and time  Skin: Warm & dry  Cardiovascular: Normal heart rate noted  Respiratory: Normal respiratory effort, no distress  Abdomen: Soft, gravid, nontender  Pelvic: Cervical exam deferred         Extremities: Edema: Trace  Fetal Status: Fetal Heart Rate (bpm): 158 Fundal Height: 32 cm Movement: Present    Results for orders placed or performed in visit on 06/18/18 (from the past 24 hour(s))  POC Urinalysis Dipstick OB   Collection Time: 06/18/18  2:08 PM  Result Value Ref Range   Color, UA     Clarity, UA     Glucose, UA Negative Negative   Bilirubin, UA     Ketones, UA neg    Spec Grav, UA     Blood, UA neg    pH, UA     POC,PROTEIN,UA Trace Negative, Trace, Small (1+), Moderate (2+), Large (3+), 4+   Urobilinogen, UA     Nitrite, UA neg    Leukocytes, UA Negative Negative   Appearance     Odor       Orders Placed This Encounter  Procedures  . Urine Culture  . POC Urinalysis Dipstick OB    Plan:  Continued routine obstetrical care, make sure BP cuff gets to pt(to call and let us know if it doesn't)  Return in about 2 weeks (around 07/02/2018) for  LROB web ex.

## 2018-06-20 LAB — URINE CULTURE

## 2018-06-25 ENCOUNTER — Encounter: Payer: Self-pay | Admitting: *Deleted

## 2018-06-25 ENCOUNTER — Telehealth: Payer: Self-pay | Admitting: Advanced Practice Midwife

## 2018-06-25 ENCOUNTER — Other Ambulatory Visit: Payer: Self-pay | Admitting: *Deleted

## 2018-06-25 MED ORDER — BLOOD PRESSURE MONITOR MISC: 0 refills | Status: DC

## 2018-06-25 NOTE — Telephone Encounter (Signed)
Patient called, stated that Manus Gunning told her that if she did not have a bp cuff by today to call.  She stated that her mail has already come and she didn't receive a bp cuff.  [phone]

## 2018-06-29 ENCOUNTER — Other Ambulatory Visit: Payer: Self-pay

## 2018-06-29 ENCOUNTER — Ambulatory Visit (INDEPENDENT_AMBULATORY_CARE_PROVIDER_SITE_OTHER): Payer: Medicaid Other | Admitting: Obstetrics and Gynecology

## 2018-06-29 DIAGNOSIS — O26893 Other specified pregnancy related conditions, third trimester: Secondary | ICD-10-CM

## 2018-06-29 DIAGNOSIS — Z3009 Encounter for other general counseling and advice on contraception: Secondary | ICD-10-CM

## 2018-06-29 DIAGNOSIS — Z3A33 33 weeks gestation of pregnancy: Secondary | ICD-10-CM

## 2018-06-29 NOTE — Progress Notes (Addendum)
Patient ID: Kelli BelfastJessica Barker, female   DOB: 01/08/1991, 28 y.o.   MRN: [id number]    TELEHEALTH OBSTETRICS PRENATAL VIRTUAL VIDEO VISIT ENCOUNTER NOTE  [facility]   I connected with Kelli Barker on 06/29/2018 at  1:30 PM EDT by WebEx Video Encounter at home and verified that I am speaking with the correct person using two identifiers.   I discussed the limitations, risks, security and privacy concerns of performing an evaluation and management service by telephone and the availability of in person appointments. I also discussed with the patient that there may be a patient responsible charge related to this service. The patient expressed understanding and agreed to proceed. Subjective:  Kelli Barker is a 28 y.o. G5P4004 at 8483w5d being seen today for ongoing prenatal care.  She is currently monitored for the following issues for this low-risk pregnancy and has Left knee pain; Depression with anxiety; Obesity, unspecified; Cigarette nicotine dependence, uncomplicated; Supervision of normal pregnancy; Late prenatal care in second trimester; and Asymptomatic bacteriuria during pregnancy in third trimester on their problem list.  Patient reports no bleeding, no contractions, no cramping, no leaking and She is experiencing some loose diarrhea couple times a day since last week and is noticing irritation from the loose stools there is no fever no other family members are having same problem.   .  .  No respiratory symptoms   . Denies any leaking of fluid.   The following portions of the patient's history were reviewed and updated as appropriate: allergies, current medications, past family history, past medical history, past social history, past surgical history and problem list.   Objective:  There were no vitals filed for this visit.  Fetal Status:           General:  Alert, oriented and cooperative. Patient is in no acute distress.   Respiratory: Normal respiratory effort, no problems with respiration noted  Mental Status: Normal mood and affect. Normal behavior. Normal judgment and thought content.   Patient states that previously noted swelling of legs has resolved  Rest of physical exam deferred due to type of encounter  Imaging: No results found.  Assessment and Plan:  Pregnancy: [id number] at 7683w5d                       Contraception plans for postpartum tubal confirmed again                       @DIAGMED @ Preterm labor symptoms and general obstetric precautions including but not limited to vaginal bleeding, contractions, leaking of fluid and fetal movement were reviewed in detail with the patient. I discussed the assessment and treatment plan with the patient. The patient was provided an opportunity to ask questions and all were answered. The patient agreed with the plan and demonstrated an understanding of the instructions. The patient was advised to call back or seek an in-person office evaluation/[facility] for any urgent or concerning symptoms. Please refer to After Visit Summary for other counseling recommendations.  Patient to notify us for any fever shortness of breath or respiratory symptoms.  Patient will add iron twice daily to address anemia.  If loose stools persist more than a week further will notify us in the office and we will consider Imodium or other agent I provided 12 minutes of face-to-face time during this encounter.  No follow-ups on file.  Will  see in 2 weeks by tele-visit  No future appointments.  By signing my name below, I, Samul Dada, attest that this documentation has been prepared under the direction and in the presence of Jonnie Kind, MD. Electronically Signed: [place]. 06/29/18. 1:33 PM.  .js I personally performed the services described in this documentation, which was SCRIBED in my presence. The recorded information has been  reviewed and considered accurate. It has been edited as necessary during review. Jonnie Kind, MD

## 2018-07-02 ENCOUNTER — Encounter: Payer: Medicaid Other | Admitting: Obstetrics and Gynecology

## 2018-07-09 DIAGNOSIS — Z3490 Encounter for supervision of normal pregnancy, unspecified, unspecified trimester: Secondary | ICD-10-CM | POA: Diagnosis not present

## 2018-07-13 ENCOUNTER — Encounter: Payer: Medicaid Other | Admitting: Women's Health

## 2018-07-22 ENCOUNTER — Encounter: Payer: Self-pay | Admitting: Women's Health

## 2018-07-22 ENCOUNTER — Other Ambulatory Visit: Payer: Self-pay

## 2018-07-22 ENCOUNTER — Ambulatory Visit (INDEPENDENT_AMBULATORY_CARE_PROVIDER_SITE_OTHER): Payer: Medicaid Other | Admitting: Women's Health

## 2018-07-22 VITALS — BP 113/59 | HR 85 | Wt 270.5 lb

## 2018-07-22 DIAGNOSIS — B009 Herpesviral infection, unspecified: Secondary | ICD-10-CM

## 2018-07-22 DIAGNOSIS — O9989 Other specified diseases and conditions complicating pregnancy, childbirth and the puerperium: Secondary | ICD-10-CM

## 2018-07-22 DIAGNOSIS — Z1389 Encounter for screening for other disorder: Secondary | ICD-10-CM

## 2018-07-22 DIAGNOSIS — R197 Diarrhea, unspecified: Secondary | ICD-10-CM

## 2018-07-22 DIAGNOSIS — R8271 Bacteriuria: Secondary | ICD-10-CM

## 2018-07-22 DIAGNOSIS — Z3483 Encounter for supervision of other normal pregnancy, third trimester: Secondary | ICD-10-CM

## 2018-07-22 DIAGNOSIS — Z331 Pregnant state, incidental: Secondary | ICD-10-CM

## 2018-07-22 DIAGNOSIS — O99013 Anemia complicating pregnancy, third trimester: Secondary | ICD-10-CM

## 2018-07-22 DIAGNOSIS — Z3A37 37 weeks gestation of pregnancy: Secondary | ICD-10-CM

## 2018-07-22 LAB — POCT URINALYSIS DIPSTICK OB
Blood, UA: NEGATIVE
Glucose, UA: NEGATIVE
Ketones, UA: NEGATIVE
Nitrite, UA: NEGATIVE
POC,PROTEIN,UA: NEGATIVE

## 2018-07-22 LAB — POCT HEMOGLOBIN: Hemoglobin: 9.8 g/dL — AB (ref 11–14.6)

## 2018-07-22 MED ORDER — ACYCLOVIR 400 MG PO TABS: 400.0000 mg | ORAL_TABLET | Freq: Three times a day (TID) | ORAL | 3 refills | Status: DC

## 2018-07-22 NOTE — Patient Instructions (Signed)
Kelli Barker, I greatly value your feedback.  If you receive a survey following your visit with [place] today, we appreciate you taking the time to fill it out.  Thanks, Kelli Barker, CNM, [facility]  [place] It is now [place] at [facility] ([place], [place] [address]) Entrance located off of [address] parking    Call the office [phone]) or go to [facility] if:  You begin to have strong, frequent contractions  Your water breaks.  Sometimes it is a big gush of fluid, sometimes it is just a trickle that keeps getting your panties wet or running down your legs  You have vaginal bleeding.  It is normal to have a small amount of spotting if your cervix was checked.   You don't feel your baby moving like normal.  If you don't, get you something to eat and drink and lay down and focus on feeling your baby move.  You should feel at least 10 movements in 2 hours.  If you don't, you should call the office or go to [facility] for [facility]   Your provider has recommended that you check your blood pressure (BP) at least once a week at home. If you do not have a blood pressure cuff at home, one will be provided for you. Contact your provider if you have not received your monitor within 1 week.   Helpful Tips for Accurate Home Blood Pressure Checks  . Don't smoke, exercise, or drink caffeine 30 minutes before checking your BP . Use the restroom before checking your BP (a full bladder can raise your pressure) . Relax in a comfortable upright chair . Feet on the ground . Left arm resting comfortably on a flat surface at the level of your heart . Legs uncrossed . Back supported . Sit quietly and don't talk . Place the cuff on your bare arm . Adjust snuggly, so that only two fingertips can fit between your skin and the top of the cuff . Check 2 readings separated by at least one minute  . Keep a log of your BP readings . For a visual, please reference this diagram: http://ccnc.care/bpdiagram  Provider Name: [facility]     Phone: [phone]  Zone 1: ALL CLEAR  Continue to monitor your symptoms:  . BP reading is less than 140 (top number) or less than 90 (bottom number)  . No right upper stomach pain . No headaches or seeing spots . No feeling nauseated or throwing up . No swelling in face and hands  Zone 2: CAUTION Call your doctor's office for any of the following:  . BP reading is greater than 140 (top number) or greater than 90 (bottom number)  . Stomach pain under your ribs in the middle or right side . Headaches or seeing spots . Feeling nauseated or throwing up . Swelling in face and hands  Zone 3: EMERGENCY  Seek immediate medical care if you have any of the following:  . BP reading is greater than160 (top number) or greater than 110 (bottom number) . Severe headaches not improving with Tylenol . Serious difficulty catching your breath . Any worsening symptoms from Zone 2   Braxton Hicks Contractions Contractions of the uterus can occur throughout pregnancy, but they are not always a sign that you are in labor. You may have practice contractions called Braxton Hicks contractions. These false labor  contractions are sometimes confused with true labor. What are Kelli PeltonBraxton Hicks contractions? Braxton Hicks contractions are tightening movements that occur in the muscles of the uterus before labor. Unlike true labor contractions, these contractions do not result in opening (dilation) and thinning of the cervix. Toward the end of pregnancy (32-34 weeks), Braxton Hicks contractions can happen more often and may become stronger. These contractions are sometimes difficult to tell apart from true labor because they can be very uncomfortable. You should not feel embarrassed if you go to the hospital with false labor. Sometimes, the only way to tell if you are in  true labor is for your health care provider to look for changes in the cervix. The health care provider will do a physical exam and may monitor your contractions. If you are not in true labor, the exam should show that your cervix is not dilating and your water has not broken. If there are no other health problems associated with your pregnancy, it is completely safe for you to be sent home with false labor. You may continue to have Braxton Hicks contractions until you go into true labor. How to tell the difference between true labor and false labor True labor  Contractions last 30-70 seconds.  Contractions become very regular.  Discomfort is usually felt in the top of the uterus, and it spreads to the lower abdomen and low back.  Contractions do not go away with walking.  Contractions usually become more intense and increase in frequency.  The cervix dilates and gets thinner. False labor  Contractions are usually shorter and not as strong as true labor contractions.  Contractions are usually irregular.  Contractions are often felt in the front of the lower abdomen and in the groin.  Contractions may go away when you walk around or change positions while lying down.  Contractions get weaker and are shorter-lasting as time goes on.  The cervix usually does not dilate or become thin. Follow these instructions at home:   Take over-the-counter and prescription medicines only as told by your health care provider.  Keep up with your usual exercises and follow other instructions from your health care provider.  Eat and drink lightly if you think you are going into labor.  If Braxton Hicks contractions are making you uncomfortable: ? Change your position from lying down or resting to walking, or change from walking to resting. ? Sit and rest in a tub of warm water. ? Drink enough fluid to keep your urine pale yellow. Dehydration may cause these contractions. ? Do slow and deep  breathing several times an hour.  Keep all follow-up prenatal visits as told by your health care provider. This is important. Contact a health care provider if:  You have a fever.  You have continuous pain in your abdomen. Get help right away if:  Your contractions become stronger, more regular, and closer together.  You have fluid leaking or gushing from your vagina.  You pass blood-tinged mucus (bloody show).  You have bleeding from your vagina.  You have low back pain that you never had before.  You feel your baby's head pushing down and causing pelvic pressure.  Your baby is not moving inside you as much as it used to. Summary  Contractions that occur before labor are called Braxton Hicks contractions, false labor, or practice contractions.  Braxton Hicks contractions are usually shorter, weaker, farther apart, and less regular than true labor contractions. True labor contractions usually become progressively stronger and  regular, and they become more frequent.  Manage discomfort from [employer] contractions by changing position, resting in a warm bath, drinking plenty of water, or practicing deep breathing. This information is not intended to replace advice given to you by your health care provider. Make sure you discuss any questions you have with your health care provider. Document Released: 05/16/2016 Document Revised: 12/13/2016 Document Reviewed: 05/16/2016 Elsevier Patient Education  2020 [employer].

## 2018-07-22 NOTE — Progress Notes (Signed)
LOW-RISK PREGNANCY VISIT Patient name: Kelli Barker MRN [id number]  Date of birth: 1990-09-24 Chief Complaint:   Routine Prenatal Visit (GBS; GC/CHL; diarrhea x 4 weeks)  History of Present Illness:   Shaleen Talamantez is a 28 y.o. G34P4004 female at [redacted]w[redacted]d with an Estimated Date of Delivery: 08/12/18 being seen today for ongoing management of a low-risk pregnancy.  Today she reports diarrhea x 4wks, now about every other day, has to take imodium multiple times during day. No fever/chills/abd pain. Contractions: Irregular. Vag. Bleeding: None.  Movement: Present. denies leaking of fluid. Review of Systems:   Pertinent items are noted in HPI Denies abnormal vaginal discharge w/ itching/odor/irritation, headaches, visual changes, shortness of breath, chest pain, abdominal pain, severe nausea/vomiting, or problems with urination or bowel movements unless otherwise stated above. Pertinent History Reviewed:  Reviewed past medical,surgical, social, obstetrical and family history.  Reviewed problem list, medications and allergies. Physical Assessment:   Vitals:   07/22/18 1214  BP: (!) 113/59  Pulse: 85  Weight: 270 lb 8 oz (122.7 kg)  Body mass index is 49.48 kg/m.        Physical Examination:   General appearance: Well appearing, and in no distress  Mental status: Alert, oriented to person, place, and time  Skin: Warm & dry  Cardiovascular: Normal heart rate noted  Respiratory: Normal respiratory effort, no distress  Abdomen: Soft, gravid, nontender  Pelvic: Cervical exam performed  Dilation: Closed Effacement (%): Thick Station: Ballotable  Extremities: Edema: Trace  Fetal Status: Fetal Heart Rate (bpm): 140 Fundal Height: 37 cm Movement: Present Presentation: Vertex  Results for orders placed or performed in visit on 07/22/18 (from the past 24 hour(s))  POC Urinalysis Dipstick OB   Collection Time: 07/22/18 12:09 PM  Result Value Ref Range   Color, UA     Clarity, UA     Glucose, UA Negative Negative   Bilirubin, UA     Ketones, UA neg    Spec Grav, UA     Blood, UA neg    pH, UA     POC,PROTEIN,UA Negative Negative, Trace, Small (1+), Moderate (2+), Large (3+), 4+   Urobilinogen, UA     Nitrite, UA neg    Leukocytes, UA Moderate (2+) (A) Negative   Appearance     Odor    POCT hemoglobin   Collection Time: 07/22/18 12:45 PM  Result Value Ref Range   Hemoglobin 9.8 (A) 11 - 14.6 g/dL    Assessment & Plan:  1) Low-risk pregnancy G5P4004 at [redacted]w[redacted]d with an Estimated Date of Delivery: 08/12/18   2) Diarrhea x 4wks, will get c-diff & stool culture, continue imodium prn  3) Anemia> taking Fe BID, 9.8 today  4) 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 acyclovir (ZOVIRAX) 400 MG tablet    Sig: Take 1 tablet (400 mg total) by mouth 3 (three) times daily.    Dispense:  30 tablet    Refill:  3    Order Specific Question:   Supervising Provider    Answer:   Florian Buff [[id number]]   Labs/procedures today: gbs, gc/ct, sve  Plan:  Continue routine obstetrical care   Reviewed: Term labor symptoms and general obstetric precautions including but not limited to vaginal bleeding, contractions, leaking of fluid and fetal movement were reviewed in detail with the patient.  All questions were answered. Has home bp cuff. Check bp weekly, let  us know if >140/90.   Follow-up: Return in about 1 week (around 07/29/2018) for LROB, Webex.  Orders Placed This Encounter  Procedures  . GC/Chlamydia Probe Amp  . Strep Gp B NAA  . Clostridium Difficile by PCR([facility])  . Stool Culture  . POC Urinalysis Dipstick OB  . POCT hemoglobin   Cheral MarkerKimberly R Booker CNM, [facility]NP-BC 07/22/2018 1:37 PM

## 2018-07-24 LAB — GC/CHLAMYDIA PROBE AMP
Chlamydia trachomatis, NAA: NEGATIVE
Neisseria Gonorrhoeae by PCR: NEGATIVE

## 2018-07-24 LAB — STREP GP B NAA: Strep Gp B NAA: NEGATIVE

## 2018-07-29 ENCOUNTER — Other Ambulatory Visit: Payer: Self-pay

## 2018-07-29 ENCOUNTER — Ambulatory Visit (INDEPENDENT_AMBULATORY_CARE_PROVIDER_SITE_OTHER): Payer: Medicaid Other | Admitting: Obstetrics and Gynecology

## 2018-07-29 VITALS — BP 102/80 | HR 85

## 2018-07-29 DIAGNOSIS — Z3A38 38 weeks gestation of pregnancy: Secondary | ICD-10-CM

## 2018-07-29 DIAGNOSIS — Z3403 Encounter for supervision of normal first pregnancy, third trimester: Secondary | ICD-10-CM

## 2018-07-29 NOTE — Progress Notes (Signed)
Patient ID: Kelli Barker, female   DOB: 1990/01/28, 28 y.o.   MRN: [id number]    [place] VIRTUAL VIDEO VISIT ENCOUNTER NOTE  Provider location: Center for [place] at [facility]   I connected with Kelli Barker on 07/29/2018 at  2:30 PM EDT by WebEx Video Encounter at home and verified that I am speaking with the correct person using two identifiers.   I discussed the limitations, risks, security and privacy concerns of performing an evaluation and management service virtually and the availability of in person appointments. I also discussed with the patient that there may be a patient responsible charge related to this service. The patient expressed understanding and agreed to proceed. Subjective:  Kelli Barker is a 28 y.o. G5P4004 at [redacted]w[redacted]d being seen today for ongoing prenatal care.  She is currently monitored for the following issues for this low-risk pregnancy and has Left knee pain; Depression with anxiety; Obesity, unspecified; Cigarette nicotine dependence, uncomplicated; Encounter for contraceptive management; Supervision of normal pregnancy; Late prenatal care in second trimester; Asymptomatic bacteriuria during pregnancy in third trimester; and HSV-2 (herpes simplex virus 2) infection on their problem list.  Patient reports no complaints.  Contractions: Irritability. Vag. Bleeding: None.  Movement: Present. Denies any leaking of fluid.   The following portions of the patient's history were reviewed and updated as appropriate: allergies, current medications, past family history, past medical history, past social history, past surgical history and problem list.   Objective:   Vitals:   07/29/18 1434  BP: 102/80  Pulse: 85    Fetal Status:     Movement: Present     General:  Alert, oriented and cooperative. Patient is in no acute distress.  Respiratory: Normal respiratory effort, no problems with respiration noted  Mental Status: Normal  mood and affect. Normal behavior. Normal judgment and thought content.  Rest of physical exam deferred due to type of encounter  Imaging: No results found.  Assessment and Plan:  Pregnancy: [id number] at [redacted]w[redacted]d                      Desire for permanent sterilization postpartum                      History of anemia on iron  Term labor symptoms and general obstetric precautions including but not limited to vaginal bleeding, contractions, leaking of fluid and fetal movement were reviewed in detail with the patient. I discussed the assessment and treatment plan with the patient. The patient was provided an opportunity to ask questions and all were answered. The patient agreed with the plan and demonstrated an understanding of the instructions. The patient was advised to call back or seek an in-person office evaluation/go to MAU at [facility] for any urgent or concerning symptoms. Please refer to After Visit Summary for other counseling recommendations.   I provided 8 minutes of face-to-face time during this encounter.  Return in about 1 week (around 08/05/2018) for [place].  No future appointments.  By signing my name below, I, Samul Dada, attest that this documentation has been prepared under the direction and in the presence of Jonnie Kind, MD. Electronically Signed: [place]. 07/29/18. 2:38 PM.  I personally performed the services described in this documentation, which was SCRIBED in my presence. The recorded information has been reviewed and considered accurate. It has been edited as necessary during review. Jonnie Kind, MD

## 2018-08-05 ENCOUNTER — Telehealth: Payer: Self-pay | Admitting: Advanced Practice Midwife

## 2018-08-05 NOTE — Telephone Encounter (Signed)
Patient called, has an appointment for tomorrow, she wants to know if she can be a tele or webex visit.  [phone]

## 2018-08-05 NOTE — Telephone Encounter (Signed)
LMOVM that she can do webex as long as she has a BP cuff.

## 2018-08-06 ENCOUNTER — Other Ambulatory Visit: Payer: Self-pay

## 2018-08-06 ENCOUNTER — Ambulatory Visit (INDEPENDENT_AMBULATORY_CARE_PROVIDER_SITE_OTHER): Payer: Medicaid Other | Admitting: Advanced Practice Midwife

## 2018-08-06 VITALS — BP 120/78 | HR 86

## 2018-08-06 DIAGNOSIS — Z3483 Encounter for supervision of other normal pregnancy, third trimester: Secondary | ICD-10-CM | POA: Diagnosis not present

## 2018-08-06 DIAGNOSIS — Z3A39 39 weeks gestation of pregnancy: Secondary | ICD-10-CM

## 2018-08-06 MED ORDER — ACYCLOVIR 400 MG PO TABS: 400.0000 mg | ORAL_TABLET | Freq: Three times a day (TID) | ORAL | 3 refills | Status: DC

## 2018-08-06 NOTE — Progress Notes (Signed)
   TELEHEALTH VIRTUAL OBSTETRICS VISIT ENCOUNTER NOTE  I connected with Kelli Barker on 08/06/18 at 10:15 AM EDT by telephone at home and verified that I am speaking with the correct person using two identifiers.   I discussed the limitations, risks, security and privacy concerns of performing an evaluation and management service by telephone and the availability of in person appointments. I also discussed with the patient that there may be a patient responsible charge related to this service. The patient expressed understanding and agreed to proceed.  Subjective:  Kelli Barker is a 28 y.o. Q0H4742 at [redacted]w[redacted]d being followed for ongoing prenatal care.  She is currently monitored for the following issues for this low-risk pregnancy and has Left knee pain; Depression with anxiety; Obesity, unspecified; Cigarette nicotine dependence, uncomplicated; Encounter for contraceptive management; Supervision of normal pregnancy; Late prenatal care in second trimester; Asymptomatic bacteriuria during pregnancy in third trimester; and HSV-2 (herpes simplex virus 2) infection on their problem list.  Patient reports no complaints. Reports fetal movement. Denies any contractions, bleeding or leaking of fluid.   The following portions of the patient's history were reviewed and updated as appropriate: allergies, current medications, past family history, past medical history, past social history, past surgical history and problem list.   Objective:   General:  Alert, oriented and cooperative.   Mental Status: Normal mood and affect perceived. Normal judgment and thought content.  Rest of physical exam deferred due to type of encounter  Assessment and Plan:  Pregnancy: G5P4004 at [redacted]w[redacted]d There are no diagnoses linked to this encounter. Term labor symptoms and general obstetric precautions including but not limited to vaginal bleeding, contractions, leaking of fluid and fetal movement were reviewed in detail  with the patient.  I discussed the assessment and treatment plan with the patient. The patient was provided an opportunity to ask questions and all were answered. The patient agreed with the plan and demonstrated an understanding of the instructions. The patient was advised to call back or seek an in-person office evaluation/go to [facility]U at [facility] for any urgent or concerning symptoms. Please refer to After Visit Summary for other counseling recommendations.   I provided 10 minutes of non-face-to-face time during this encounter.  Return for LROB/NST on 7/28 or 29.  Future Appointments  Date Time Provider [place]  08/12/2018 11:00 AM Jonnie Kind, MD [facility] [place], [place] for [facility], [place]

## 2018-08-10 ENCOUNTER — Telehealth: Payer: Self-pay | Admitting: Advanced Practice Midwife

## 2018-08-10 MED ORDER — ACYCLOVIR 400 MG PO TABS: 400.0000 mg | ORAL_TABLET | Freq: Three times a day (TID) | ORAL | 3 refills | Status: DC

## 2018-08-10 NOTE — Telephone Encounter (Signed)
Pt states that [facility] in [place] still have not received her acyclovir (ZOVIRAX) 400 MG tablet

## 2018-08-10 NOTE — Telephone Encounter (Signed)
RX to pharmacy, the original looks like it printed.

## 2018-08-12 ENCOUNTER — Telehealth (HOSPITAL_COMMUNITY): Payer: Self-pay | Admitting: *Deleted

## 2018-08-12 ENCOUNTER — Ambulatory Visit (INDEPENDENT_AMBULATORY_CARE_PROVIDER_SITE_OTHER): Payer: Medicaid Other | Admitting: Obstetrics and Gynecology

## 2018-08-12 ENCOUNTER — Other Ambulatory Visit: Payer: Self-pay

## 2018-08-12 VITALS — BP 104/66 | HR 94 | Wt 276.0 lb

## 2018-08-12 DIAGNOSIS — Z3A40 40 weeks gestation of pregnancy: Secondary | ICD-10-CM | POA: Diagnosis not present

## 2018-08-12 DIAGNOSIS — Z3483 Encounter for supervision of other normal pregnancy, third trimester: Secondary | ICD-10-CM

## 2018-08-12 DIAGNOSIS — Z1389 Encounter for screening for other disorder: Secondary | ICD-10-CM

## 2018-08-12 DIAGNOSIS — O0933 Supervision of pregnancy with insufficient antenatal care, third trimester: Secondary | ICD-10-CM | POA: Diagnosis not present

## 2018-08-12 DIAGNOSIS — O0932 Supervision of pregnancy with insufficient antenatal care, second trimester: Secondary | ICD-10-CM

## 2018-08-12 DIAGNOSIS — Z331 Pregnant state, incidental: Secondary | ICD-10-CM

## 2018-08-12 NOTE — Telephone Encounter (Signed)
Preadmission screen  

## 2018-08-12 NOTE — Progress Notes (Signed)
Patient ID: Kelli Barker, female   DOB: [birth year], 28 y.o.   MRN: [id number]    LOW-RISK PREGNANCY VISIT Patient name: Kelli Barker MRN [id number]  Date of birth: Feb 14, 1990 Chief Complaint:   Routine Prenatal Visit (nst)  History of Present Illness:   Kelli Barker is a 28 y.o. G61P4004 female at [redacted]w[redacted]d with an Estimated Date of Delivery: 08/12/18 being seen today for ongoing management of a low-risk pregnancy.  Today she reports no complaints. Contractions: Irritability. Vag. Bleeding: None.  Movement: Present. denies leaking of fluid. Review of Systems:   Pertinent items are noted in HPI Denies abnormal vaginal discharge w/ itching/odor/irritation, headaches, visual changes, shortness of breath, chest pain, abdominal pain, severe nausea/vomiting, or problems with urination or bowel movements unless otherwise stated above. Pertinent History Reviewed:  Reviewed past medical,surgical, social, obstetrical and family history.  Reviewed problem list, medications and allergies. Physical Assessment:   Vitals:   08/12/18 1050  BP: 104/66  Pulse: 94  Weight: 276 lb (125.2 kg)  Body mass index is 50.48 kg/m.        Physical Examination:   General appearance: Well appearing, and in no distress  Mental status: Alert, oriented to person, place, and time  Skin: Warm & dry  Cardiovascular: Normal heart rate noted  Respiratory: Normal respiratory effort, no distress  Abdomen: Soft, gravid, nontender  Pelvic: Cervical exam performed high, cervix closed          Extremities: Edema: Trace  Fetal Status:   Fundal Height: 39 cm Movement: Present    No results found for this or any previous visit (from the past 24 hour(s)).  Assessment & Plan:  1) Low-risk pregnancy G5P4004 at [redacted]w[redacted]d with an Estimated Date of Delivery: 08/12/18     Meds: No orders of the defined types were placed in this encounter.  Labs/procedures today: NST  Plan:   Schedule IOL 08/19/2018 7 am PP and tubal pre-op 4  weeks after delivery  Reviewed: Term labor symptoms and general obstetric precautions including but not limited to vaginal bleeding, contractions, leaking of fluid and fetal movement were reviewed in detail with the patient.  Follow-up: No follow-ups on file.  Orders Placed This Encounter  Procedures  . POC Urinalysis Dipstick OB   By signing my name below, I, Samul Dada, attest that this documentation has been prepared under the direction and in the presence of Jonnie Kind, MD. Electronically Signed: [place]. 08/12/18. 11:19 AM.  I personally performed the services described in this documentation, which was SCRIBED in my presence. The recorded information has been reviewed and considered accurate. It has been edited as necessary during review. Jonnie Kind, MD

## 2018-08-13 ENCOUNTER — Encounter (HOSPITAL_COMMUNITY): Payer: Self-pay | Admitting: *Deleted

## 2018-08-13 ENCOUNTER — Telehealth (HOSPITAL_COMMUNITY): Payer: Self-pay | Admitting: *Deleted

## 2018-08-13 NOTE — Telephone Encounter (Signed)
Preadmission screen  

## 2018-08-17 ENCOUNTER — Other Ambulatory Visit: Payer: Self-pay

## 2018-08-17 ENCOUNTER — Other Ambulatory Visit (HOSPITAL_COMMUNITY)
Admission: RE | Admit: 2018-08-17 | Discharge: 2018-08-17 | Disposition: A | Discharge status: Home / Self Care | Payer: Medicaid Other | Source: Ambulatory Visit | Attending: Obstetrics and Gynecology | Admitting: Obstetrics and Gynecology

## 2018-08-17 DIAGNOSIS — Z20828 Contact with and (suspected) exposure to other viral communicable diseases: Secondary | ICD-10-CM | POA: Insufficient documentation

## 2018-08-17 DIAGNOSIS — Z01812 Encounter for preprocedural laboratory examination: Secondary | ICD-10-CM | POA: Insufficient documentation

## 2018-08-17 LAB — SARS CORONAVIRUS 2 (TAT 6-24 HRS): SARS Coronavirus 2: NEGATIVE

## 2018-08-17 NOTE — MAU Note (Signed)
Asymptomatic, swab collected. 

## 2018-08-19 ENCOUNTER — Inpatient Hospital Stay (HOSPITAL_COMMUNITY)
Admission: AD | Admit: 2018-08-19 | Payer: Medicaid Other | Source: Home / Self Care | Admitting: Obstetrics and Gynecology

## 2018-08-19 ENCOUNTER — Inpatient Hospital Stay (HOSPITAL_COMMUNITY)
Admission: AD | Admit: 2018-08-19 | Discharge: 2018-08-23 | DRG: 784 | Disposition: A | Discharge status: Home / Self Care | Payer: Medicaid Other | Attending: Obstetrics & Gynecology | Admitting: Obstetrics & Gynecology

## 2018-08-19 ENCOUNTER — Other Ambulatory Visit: Payer: Self-pay

## 2018-08-19 ENCOUNTER — Encounter (HOSPITAL_COMMUNITY): Payer: Self-pay | Admitting: *Deleted

## 2018-08-19 ENCOUNTER — Inpatient Hospital Stay (HOSPITAL_COMMUNITY): Payer: Medicaid Other

## 2018-08-19 DIAGNOSIS — Z87891 Personal history of nicotine dependence: Secondary | ICD-10-CM | POA: Diagnosis not present

## 2018-08-19 DIAGNOSIS — O324XX0 Maternal care for high head at term, not applicable or unspecified: Secondary | ICD-10-CM | POA: Diagnosis present

## 2018-08-19 DIAGNOSIS — O99214 Obesity complicating childbirth: Secondary | ICD-10-CM | POA: Diagnosis not present

## 2018-08-19 DIAGNOSIS — D62 Acute posthemorrhagic anemia: Secondary | ICD-10-CM | POA: Diagnosis not present

## 2018-08-19 DIAGNOSIS — O9081 Anemia of the puerperium: Secondary | ICD-10-CM | POA: Diagnosis not present

## 2018-08-19 DIAGNOSIS — Z3490 Encounter for supervision of normal pregnancy, unspecified, unspecified trimester: Secondary | ICD-10-CM

## 2018-08-19 DIAGNOSIS — O9832 Other infections with a predominantly sexual mode of transmission complicating childbirth: Secondary | ICD-10-CM | POA: Diagnosis present

## 2018-08-19 DIAGNOSIS — O0932 Supervision of pregnancy with insufficient antenatal care, second trimester: Secondary | ICD-10-CM

## 2018-08-19 DIAGNOSIS — Z3A41 41 weeks gestation of pregnancy: Secondary | ICD-10-CM | POA: Diagnosis not present

## 2018-08-19 DIAGNOSIS — O99013 Anemia complicating pregnancy, third trimester: Secondary | ICD-10-CM | POA: Diagnosis present

## 2018-08-19 DIAGNOSIS — O480 Post-term pregnancy: Secondary | ICD-10-CM | POA: Diagnosis not present

## 2018-08-19 DIAGNOSIS — A6000 Herpesviral infection of urogenital system, unspecified: Secondary | ICD-10-CM | POA: Diagnosis present

## 2018-08-19 DIAGNOSIS — Z302 Encounter for sterilization: Secondary | ICD-10-CM

## 2018-08-19 DIAGNOSIS — Z98891 History of uterine scar from previous surgery: Secondary | ICD-10-CM

## 2018-08-19 LAB — CBC
HCT: 27.5 % — ABNORMAL LOW (ref 36.0–46.0)
Hemoglobin: 8.3 g/dL — ABNORMAL LOW (ref 12.0–15.0)
MCH: 22.9 pg — ABNORMAL LOW (ref 26.0–34.0)
MCHC: 30.2 g/dL (ref 30.0–36.0)
MCV: 76 fL — ABNORMAL LOW (ref 80.0–100.0)
Platelets: 216 10*3/uL (ref 150–400)
RBC: 3.62 MIL/uL — ABNORMAL LOW (ref 3.87–5.11)
RDW: 16.7 % — ABNORMAL HIGH (ref 11.5–15.5)
WBC: 8 10*3/uL (ref 4.0–10.5)
nRBC: 0.4 % — ABNORMAL HIGH (ref 0.0–0.2)

## 2018-08-19 LAB — ABO/RH: ABO/RH(D): AB POS

## 2018-08-19 MED ORDER — MISOPROSTOL 25 MCG QUARTER TABLET
25.0000 ug | ORAL_TABLET | ORAL | Status: DC
  Administered 2018-08-19: 14:00:00 25 ug via VAGINAL
  Filled 2018-08-19 (×2): qty 1

## 2018-08-19 MED ORDER — OXYCODONE-ACETAMINOPHEN 5-325 MG PO TABS: 1.0000 | ORAL_TABLET | ORAL | Status: DC | PRN

## 2018-08-19 MED ORDER — ONDANSETRON HCL 4 MG/2ML IJ SOLN: 4.0000 mg | Freq: Four times a day (QID) | INTRAMUSCULAR | Status: DC | PRN

## 2018-08-19 MED ORDER — FENTANYL CITRATE (PF) 100 MCG/2ML IJ SOLN
100.0000 ug | INTRAMUSCULAR | Status: DC | PRN
  Administered 2018-08-19 – 2018-08-20 (×4): 100 ug via INTRAVENOUS
  Filled 2018-08-19 (×3): qty 2

## 2018-08-19 MED ORDER — LACTATED RINGERS IV SOLN: 500.0000 mL | Freq: Once | INTRAVENOUS | Status: DC

## 2018-08-19 MED ORDER — FENTANYL-BUPIVACAINE-NACL 0.5-0.125-0.9 MG/250ML-% EP SOLN
12.0000 mL/h | EPIDURAL | Status: DC | PRN
  Filled 2018-08-19: qty 250

## 2018-08-19 MED ORDER — EPHEDRINE 5 MG/ML INJ: 10.0000 mg | INTRAVENOUS | Status: DC | PRN

## 2018-08-19 MED ORDER — SOD CITRATE-CITRIC ACID 500-334 MG/5ML PO SOLN
30.0000 mL | ORAL | Status: DC | PRN
  Administered 2018-08-20 (×2): 30 mL via ORAL
  Filled 2018-08-19 (×2): qty 30

## 2018-08-19 MED ORDER — LACTATED RINGERS IV SOLN: 500.0000 mL | INTRAVENOUS | Status: DC | PRN

## 2018-08-19 MED ORDER — DIPHENHYDRAMINE HCL 50 MG/ML IJ SOLN: 12.5000 mg | INTRAMUSCULAR | Status: DC | PRN

## 2018-08-19 MED ORDER — LACTATED RINGERS IV SOLN
INTRAVENOUS | Status: DC
  Administered 2018-08-20 (×3): via INTRAVENOUS

## 2018-08-19 MED ORDER — OXYTOCIN 40 UNITS IN NORMAL SALINE INFUSION - SIMPLE MED
2.5000 [IU]/h | INTRAVENOUS | Status: DC
  Filled 2018-08-19: qty 1000

## 2018-08-19 MED ORDER — MISOPROSTOL 50MCG HALF TABLET
50.0000 ug | ORAL_TABLET | ORAL | Status: DC
  Administered 2018-08-19: 50 ug via ORAL
  Filled 2018-08-19: qty 1

## 2018-08-19 MED ORDER — ACETAMINOPHEN 325 MG PO TABS: 650.0000 mg | ORAL_TABLET | ORAL | Status: DC | PRN

## 2018-08-19 MED ORDER — FENTANYL CITRATE (PF) 100 MCG/2ML IJ SOLN
INTRAMUSCULAR | Status: AC
  Filled 2018-08-19: qty 2

## 2018-08-19 MED ORDER — PHENYLEPHRINE 40 MCG/ML (10ML) SYRINGE FOR IV PUSH (FOR BLOOD PRESSURE SUPPORT)
80.0000 ug | PREFILLED_SYRINGE | INTRAVENOUS | Status: DC | PRN
  Filled 2018-08-19: qty 10

## 2018-08-19 MED ORDER — OXYCODONE-ACETAMINOPHEN 5-325 MG PO TABS: 2.0000 | ORAL_TABLET | ORAL | Status: DC | PRN

## 2018-08-19 MED ORDER — PHENYLEPHRINE 40 MCG/ML (10ML) SYRINGE FOR IV PUSH (FOR BLOOD PRESSURE SUPPORT)
80.0000 ug | PREFILLED_SYRINGE | INTRAVENOUS | Status: DC | PRN
  Administered 2018-08-20 (×2): 80 ug via INTRAVENOUS

## 2018-08-19 MED ORDER — LIDOCAINE HCL (PF) 1 % IJ SOLN: 30.0000 mL | INTRAMUSCULAR | Status: DC | PRN

## 2018-08-19 MED ORDER — FLEET ENEMA 7-19 GM/118ML RE ENEM: 1.0000 | ENEMA | RECTAL | Status: DC | PRN

## 2018-08-19 MED ORDER — OXYTOCIN BOLUS FROM INFUSION: 500.0000 mL | Freq: Once | INTRAVENOUS | Status: DC

## 2018-08-19 NOTE — H&P (Addendum)
LABOR AND DELIVERY ADMISSION HISTORY AND PHYSICAL NOTE  Kelli Barker is a 28 y.o. female [id number] with IUP at 5254w0d by 27wk U/S presenting for PDIOL.  She reports positive fetal movement. She denies leakage of fluid or vaginal bleeding.  Prenatal History/Complications: PNC at [facility] Pregnancy complications:  -Genital herpes, on suppression no active lesions -PNC w/ [facility] @[redacted]w[redacted]d   Past Medical History: Past Medical History:  Diagnosis Date  . Anxiety   . Depression   . Herpes genitalia     Past Surgical History: Past Surgical History:  Procedure Laterality Date  . CHOLECYSTECTOMY  08-2008    Obstetrical History: OB History    Gravida  5   Para  4   Term  4   Preterm      AB      Living  4     SAB      TAB      Ectopic      Multiple      Live Births  4           Social History: Social History   Socioeconomic History  . Marital status: Single    Spouse name: Forde RadonCatrell Graham  . Number of children: 4  . Years of education: Not on file  . Highest education level: Some college, no degree  Occupational History  . Occupation: homemaker  Social Needs  . Financial resource strain: Not very hard  . Food insecurity    Worry: Never true    Inability: Never true  . Transportation needs    Medical: No    Non-medical: No  Tobacco Use  . Smoking status: Former Smoker    Years: 10.00    Types: Cigarettes    Quit date: 01/15/2015    Years since quitting: 3.5  . Smokeless tobacco: Never Used  Substance and Sexual Activity  . Alcohol use: Not Currently    Comment: occasionally  . Drug use: No  . Sexual activity: Yes    Birth control/protection: None  Lifestyle  . Physical activity    Days per week: 5 days    Minutes per session: 20 min  . Stress: Rather much  Relationships  . Social Musicianconnections    Talks on phone: Three times a week    Gets together: Three times a week    Attends religious service: More than 4 times per year    Active member of club  or organization: Yes    Attends meetings of clubs or organizations: More than 4 times per year    Relationship status: Living with partner  Other Topics Concern  . Not on file  Social History Narrative  . Not on file    Family History: Family History  Problem Relation Age of Onset  . COPD Father   . Hypertension Maternal Grandfather   . Cancer Maternal Grandfather     Allergies: Allergies  Allergen Reactions  . Sulfa Antibiotics     Child hood-unknown reaction    Medications Prior to Admission  Medication Sig Dispense Refill Last Dose  . acyclovir (ZOVIRAX) 400 MG tablet Take 1 tablet (400 mg total) by mouth 3 (three) times daily. 30 tablet 3   . albuterol (PROVENTIL HFA;VENTOLIN HFA) 108 (90 Base) MCG/ACT inhaler Inhale 2 puffs every 4 (four) hours as needed into the lungs for wheezing or shortness of breath. 1 Inhaler 3   . Blood Pressure Monitor MISC For regular home bp monitoring during pregnancy 1 each 0   . calcium  carbonate (TUMS EX) 750 MG chewable tablet Chew 1 tablet by mouth as needed.      . ferrous sulfate 325 (65 FE) MG tablet Take 1 tablet (325 mg total) by mouth 2 (two) times daily with a meal. 60 tablet 3   . pantoprazole (PROTONIX) 20 MG tablet Take 1 tablet (20 mg total) by mouth daily. 30 tablet 3   . Prenatal Vit-Fe Fumarate-FA (MULTIVITAMIN-PRENATAL) 27-0.8 MG TABS tablet Take 1 tablet by mouth daily at 12 noon.        Review of Systems  All systems reviewed and negative except as stated in HPI  Physical Exam Blood pressure 113/66, pulse 91, temperature 98.5 F (36.9 C), height 5\' 2"  (1.575 m), weight 125.2 kg, last menstrual period 11/19/2017. General appearance: alert, oriented, NAD Lungs: normal respiratory effort Heart: regular rate Abdomen: soft, non-tender; gravid, FH appropriate for GA Extremities: No calf swelling or tenderness Pelvic: No active lesions Presentation: Vertex on U/S Fetal monitoring: 150 bpm moderate variability 15 x 15  acels no decels Uterine activity: irritability Dilation: Fingertip Effacement (%): 50 Station: -2 Exam by:: m wilkins rnc  Prenatal labs: ABO, Rh: --/--/AB POS (08/05 1306) Antibody: NEG (08/05 1306) Rubella: 1.50 (05/15 1125) RPR: Non Reactive (05/18 0848)  HBsAg: Negative (05/15 1125)  HIV: Non Reactive (05/18 0848)  GC/Chlamydia: Negative 05/15 0000 GBS: Negative (07/08 0000)  2-hr GTT: 93 Genetic screening: WNL Anatomy US: Normal  Prenatal Transfer Tool  Maternal Diabetes: No Genetic Screening: Normal Maternal Ultrasounds/Referrals: Normal Fetal Ultrasounds or other Referrals:  None Maternal Substance Abuse:  No Significant Maternal Medications:  Meds include: Other: Acyclovir Significant Maternal Lab Results: Group B Strep negative  Results for orders placed or performed during the hospital encounter of 08/19/18 (from the past 24 hour(s))  CBC   Collection Time: 08/19/18  1:05 PM  Result Value Ref Range   WBC 8.0 4.0 - 10.5 K/uL   RBC 3.62 (L) 3.87 - 5.11 MIL/uL   Hemoglobin 8.3 (L) 12.0 - 15.0 g/dL   HCT 27.5 (L) 36.0 - 46.0 %   MCV 76.0 (L) 80.0 - 100.0 fL   MCH 22.9 (L) 26.0 - 34.0 pg   MCHC 30.2 30.0 - 36.0 g/dL   RDW 16.7 (H) 11.5 - 15.5 %   Platelets 216 150 - 400 K/uL   nRBC 0.4 (H) 0.0 - 0.2 %  Type and screen [place]   Collection Time: 08/19/18  1:06 PM  Result Value Ref Range   ABO/RH(D) AB POS    Antibody Screen NEG    Sample Expiration      08/22/2018,2359 Performed at [facility], [place] [address] [place], [place] [address]     Patient Active Problem List   Diagnosis Date Noted  . Indication for care in labor or delivery 08/19/2018  . 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 Asymptomatic bacteriuria during pregnancy in third trimester 06/01/2018  . Supervision of normal pregnancy 05/29/2018  . Late prenatal care in second trimester 05/29/2018  . Encounter for contraceptive management 05/15/2018  .  Left knee pain 03/01/2015  . Depression with anxiety 03/01/2015  . Obesity, unspecified 03/01/2015  . Cigarette nicotine dependence, uncomplicated 08/65/7846    Assessment: Kelli Barker is a 28 y.o. G5P4004 at [redacted]w[redacted]d here for [place].  #Labor: 71mcg Vaginal Cytotec x1 given at 1333. Consider FB with next cervical exam. Continue time appropriate cervical exams. #Pain: May have epidural #FWB: Cat I, Vertex on U/S, EFW  w/ leopold 8lbs  #ID: GBS negative #MOF: Breast #MOC: BTL 09/02 Dr. Emelda FearFerguson #Circ: outpatient  HSV-1: Was on acyclovir for suppression; no active lesions seen.  Lavonda JumboSimone Autry-Lott, DO Family Medicine PGY-1 08/19/2018, 3:44 PM  Midwife attestation: I have seen and examined this patient; I agree with above documentation in the resident's note.   PE: Gen: calm comfortable, NAD Resp: normal effort and rate Abd: gravid  ROS, labs, PMH reviewed  Assessment/Plan: Kelli Barker is a 28 y.o. [phone] here for IOL for post-dates Admit to LD Labor: latent FWB: Cat I GBS neg Start Cytotec for ripening  Donette LarryMelanie Trinka Keshishyan, CNM  08/19/2018, 4:38 PM

## 2018-08-19 NOTE — Progress Notes (Signed)
Discussed with provider- UC pattern- UC's greater than 5 in 10 min- holding cytotec at this time

## 2018-08-19 NOTE — Progress Notes (Signed)
OB/[facility]N Faculty Practice: Labor Progress Note  Subjective: Patient doing well without complaints.   Objective: BP 116/60   Pulse 90   Temp 98.5 F (36.9 C) (Oral)   Resp 18   Ht 5\' 2"  (1.575 m)   Wt 125.2 kg   LMP 11/19/2017 (Within Days)   BMI 50.48 kg/m  Gen: well appearing, NAD Dilation: Fingertip Effacement (%): 50 Cervical Position: Middle Station: -2 Presentation: Vertex Exam by:: m wilkins rnc  FHT: baseline 140bpm, mod variability, +accels, no decels Toco: q1-2 minutes  Assessment and Plan: 28 y.o. [id number] [redacted]w[redacted]d IOL-PD.  Labor: progressing well. cytotec x2. Given patient is still at a -3 station and not tolerating FB placement will give another cytotec and attempt FB at next check. Continue time appropriate cervical exams. -- pain control: well controlled currently -- PPH Risk: low  Fetal Well-Being: EFW 1229g (100%ile) at 27w u/s. Cephalic by cervical exam.  -- Category 1 - continuous fetal monitoring  -- GBS negative  HSV --no lesions --on valtrex supression  Fetal macrosomia --EFW as noted above  Maternal anemia --CBC on admit showed hgb 8.3 --post partum cbc  Hx of anxiety/depression --husband has stage 4 lymphoma --not on meds --monitor for post partum depression  Outpatient BTL --papers signed --pre op scheduled   Corliss Blacker, [facility] 9:43 PM

## 2018-08-20 ENCOUNTER — Inpatient Hospital Stay (HOSPITAL_COMMUNITY): Payer: Medicaid Other | Admitting: Anesthesiology

## 2018-08-20 ENCOUNTER — Encounter (HOSPITAL_COMMUNITY)
Admission: AD | Disposition: A | Discharge status: Home / Self Care | Payer: Self-pay | Source: Home / Self Care | Attending: Obstetrics & Gynecology

## 2018-08-20 ENCOUNTER — Encounter (HOSPITAL_COMMUNITY): Payer: Self-pay | Admitting: Anesthesiology

## 2018-08-20 DIAGNOSIS — Z3A41 41 weeks gestation of pregnancy: Secondary | ICD-10-CM

## 2018-08-20 DIAGNOSIS — Z302 Encounter for sterilization: Secondary | ICD-10-CM

## 2018-08-20 DIAGNOSIS — D62 Acute posthemorrhagic anemia: Secondary | ICD-10-CM | POA: Diagnosis not present

## 2018-08-20 DIAGNOSIS — O99214 Obesity complicating childbirth: Secondary | ICD-10-CM

## 2018-08-20 DIAGNOSIS — O99013 Anemia complicating pregnancy, third trimester: Secondary | ICD-10-CM | POA: Diagnosis present

## 2018-08-20 DIAGNOSIS — O480 Post-term pregnancy: Secondary | ICD-10-CM

## 2018-08-20 HISTORY — PX: TUBAL LIGATION: SHX77

## 2018-08-20 LAB — CBC
HCT: 28.1 % — ABNORMAL LOW (ref 36.0–46.0)
Hemoglobin: 8.9 g/dL — ABNORMAL LOW (ref 12.0–15.0)
MCH: 24.7 pg — ABNORMAL LOW (ref 26.0–34.0)
MCHC: 31.7 g/dL (ref 30.0–36.0)
MCV: 77.8 fL — ABNORMAL LOW (ref 80.0–100.0)
Platelets: 206 10*3/uL (ref 150–400)
RBC: 3.61 MIL/uL — ABNORMAL LOW (ref 3.87–5.11)
RDW: 17.1 % — ABNORMAL HIGH (ref 11.5–15.5)
WBC: 15.2 10*3/uL — ABNORMAL HIGH (ref 4.0–10.5)
nRBC: 0.2 % (ref 0.0–0.2)

## 2018-08-20 LAB — PREPARE RBC (CROSSMATCH)

## 2018-08-20 LAB — RPR: RPR Ser Ql: NONREACTIVE

## 2018-08-20 SURGERY — Surgical Case
Anesthesia: Epidural | Site: Abdomen | Wound class: Clean Contaminated

## 2018-08-20 MED ORDER — ACETAMINOPHEN 500 MG PO TABS
1000.0000 mg | ORAL_TABLET | Freq: Four times a day (QID) | ORAL | Status: AC
  Administered 2018-08-20 – 2018-08-21 (×3): 1000 mg via ORAL
  Filled 2018-08-20 (×3): qty 2

## 2018-08-20 MED ORDER — TETANUS-DIPHTH-ACELL PERTUSSIS 5-2.5-18.5 LF-MCG/0.5 IM SUSP: 0.5000 mL | Freq: Once | INTRAMUSCULAR | Status: DC

## 2018-08-20 MED ORDER — HYDROMORPHONE HCL 1 MG/ML IJ SOLN: 1.0000 mg | INTRAMUSCULAR | Status: DC | PRN

## 2018-08-20 MED ORDER — ONDANSETRON HCL 4 MG/2ML IJ SOLN: 4.0000 mg | Freq: Three times a day (TID) | INTRAMUSCULAR | Status: DC | PRN

## 2018-08-20 MED ORDER — NALBUPHINE HCL 10 MG/ML IJ SOLN: 5.0000 mg | Freq: Once | INTRAMUSCULAR | Status: DC | PRN

## 2018-08-20 MED ORDER — DIBUCAINE (PERIANAL) 1 % EX OINT: 1.0000 "application " | TOPICAL_OINTMENT | CUTANEOUS | Status: DC | PRN

## 2018-08-20 MED ORDER — FUROSEMIDE 10 MG/ML IJ SOLN
20.0000 mg | Freq: Once | INTRAMUSCULAR | Status: AC
  Administered 2018-08-20: 20 mg via INTRAVENOUS
  Filled 2018-08-20: qty 4

## 2018-08-20 MED ORDER — ENOXAPARIN SODIUM 60 MG/0.6ML ~~LOC~~ SOLN
60.0000 mg | SUBCUTANEOUS | Status: DC
  Administered 2018-08-21 – 2018-08-22 (×2): 60 mg via SUBCUTANEOUS
  Filled 2018-08-20 (×2): qty 0.6

## 2018-08-20 MED ORDER — PROPOFOL 500 MG/50ML IV EMUL
INTRAVENOUS | Status: DC | PRN
  Administered 2018-08-20: 75 ug/kg/min via INTRAVENOUS

## 2018-08-20 MED ORDER — METHYLERGONOVINE MALEATE 0.2 MG/ML IJ SOLN
INTRAMUSCULAR | Status: DC | PRN
  Administered 2018-08-20: 0.2 mg via INTRAMUSCULAR

## 2018-08-20 MED ORDER — FENTANYL CITRATE (PF) 100 MCG/2ML IJ SOLN
25.0000 ug | INTRAMUSCULAR | Status: DC | PRN
  Administered 2018-08-20 (×2): 50 ug via INTRAVENOUS

## 2018-08-20 MED ORDER — KETOROLAC TROMETHAMINE 30 MG/ML IJ SOLN: 30.0000 mg | Freq: Once | INTRAMUSCULAR | Status: DC

## 2018-08-20 MED ORDER — DIPHENHYDRAMINE HCL 25 MG PO CAPS: 25.0000 mg | ORAL_CAPSULE | Freq: Four times a day (QID) | ORAL | Status: DC | PRN

## 2018-08-20 MED ORDER — SODIUM BICARBONATE 8.4 % IV SOLN
INTRAVENOUS | Status: AC
  Filled 2018-08-20: qty 50

## 2018-08-20 MED ORDER — COCONUT OIL OIL
1.0000 "application " | TOPICAL_OIL | Status: DC | PRN
  Administered 2018-08-21: 1 via TOPICAL

## 2018-08-20 MED ORDER — MORPHINE SULFATE (PF) 0.5 MG/ML IJ SOLN
INTRAMUSCULAR | Status: AC
  Filled 2018-08-20: qty 10

## 2018-08-20 MED ORDER — SODIUM CHLORIDE (PF) 0.9 % IJ SOLN
INTRAMUSCULAR | Status: DC | PRN
  Administered 2018-08-20: 12 mL/h via EPIDURAL

## 2018-08-20 MED ORDER — KETOROLAC TROMETHAMINE 30 MG/ML IJ SOLN
30.0000 mg | Freq: Four times a day (QID) | INTRAMUSCULAR | Status: AC
  Administered 2018-08-20 – 2018-08-21 (×3): 30 mg via INTRAVENOUS
  Filled 2018-08-20 (×3): qty 1

## 2018-08-20 MED ORDER — SENNOSIDES-DOCUSATE SODIUM 8.6-50 MG PO TABS
2.0000 | ORAL_TABLET | ORAL | Status: DC
  Administered 2018-08-20 – 2018-08-21 (×2): 2 via ORAL
  Filled 2018-08-20 (×3): qty 2

## 2018-08-20 MED ORDER — MIDAZOLAM HCL 2 MG/2ML IJ SOLN
INTRAMUSCULAR | Status: DC | PRN
  Administered 2018-08-20: 2 mg via INTRAVENOUS

## 2018-08-20 MED ORDER — DIPHENHYDRAMINE HCL 25 MG PO CAPS: 25.0000 mg | ORAL_CAPSULE | ORAL | Status: DC | PRN

## 2018-08-20 MED ORDER — ONDANSETRON HCL 4 MG/2ML IJ SOLN
INTRAMUSCULAR | Status: AC
  Filled 2018-08-20: qty 2

## 2018-08-20 MED ORDER — PROPOFOL 500 MG/50ML IV EMUL
INTRAVENOUS | Status: AC
  Filled 2018-08-20: qty 50

## 2018-08-20 MED ORDER — NALBUPHINE HCL 10 MG/ML IJ SOLN: 5.0000 mg | INTRAMUSCULAR | Status: DC | PRN

## 2018-08-20 MED ORDER — LORAZEPAM 2 MG/ML IJ SOLN
0.5000 mg | INTRAMUSCULAR | Status: DC | PRN
  Administered 2018-08-20: 1 mg via INTRAVENOUS
  Filled 2018-08-20: qty 1

## 2018-08-20 MED ORDER — MEASLES, MUMPS & RUBELLA VAC IJ SOLR: 0.5000 mL | Freq: Once | INTRAMUSCULAR | Status: DC

## 2018-08-20 MED ORDER — ACETAMINOPHEN 10 MG/ML IV SOLN
1000.0000 mg | Freq: Once | INTRAVENOUS | Status: DC | PRN
  Administered 2018-08-20: 1000 mg via INTRAVENOUS

## 2018-08-20 MED ORDER — MORPHINE SULFATE (PF) 0.5 MG/ML IJ SOLN
INTRAMUSCULAR | Status: DC | PRN
  Administered 2018-08-20: 4 mg via EPIDURAL

## 2018-08-20 MED ORDER — FENTANYL CITRATE (PF) 100 MCG/2ML IJ SOLN
INTRAMUSCULAR | Status: DC | PRN
  Administered 2018-08-20 (×2): 50 ug via INTRAVENOUS

## 2018-08-20 MED ORDER — LACTATED RINGERS IV SOLN: 500.0000 mL | Freq: Once | INTRAVENOUS | Status: DC

## 2018-08-20 MED ORDER — WITCH HAZEL-GLYCERIN EX PADS: 1.0000 "application " | MEDICATED_PAD | CUTANEOUS | Status: DC | PRN

## 2018-08-20 MED ORDER — LIDOCAINE HCL (PF) 1 % IJ SOLN
INTRAMUSCULAR | Status: DC | PRN
  Administered 2018-08-20 (×2): 4 mL via EPIDURAL

## 2018-08-20 MED ORDER — MAGNESIUM HYDROXIDE 400 MG/5ML PO SUSP: 30.0000 mL | ORAL | Status: DC | PRN

## 2018-08-20 MED ORDER — FERROUS SULFATE 325 (65 FE) MG PO TABS
325.0000 mg | ORAL_TABLET | Freq: Two times a day (BID) | ORAL | Status: DC
  Administered 2018-08-21 – 2018-08-23 (×5): 325 mg via ORAL
  Filled 2018-08-20 (×5): qty 1

## 2018-08-20 MED ORDER — PRENATAL MULTIVITAMIN CH
1.0000 | ORAL_TABLET | Freq: Every day | ORAL | Status: DC
  Administered 2018-08-21 – 2018-08-23 (×3): 1 via ORAL
  Filled 2018-08-20 (×3): qty 1

## 2018-08-20 MED ORDER — PIPERACILLIN-TAZOBACTAM 3.375 G IVPB
3.3750 g | Freq: Three times a day (TID) | INTRAVENOUS | Status: AC
  Administered 2018-08-21 (×2): 3.375 g via INTRAVENOUS
  Filled 2018-08-20 (×3): qty 50

## 2018-08-20 MED ORDER — OXYCODONE-ACETAMINOPHEN 5-325 MG PO TABS: 2.0000 | ORAL_TABLET | ORAL | Status: DC | PRN

## 2018-08-20 MED ORDER — ACETAMINOPHEN 10 MG/ML IV SOLN
INTRAVENOUS | Status: AC
  Filled 2018-08-20: qty 100

## 2018-08-20 MED ORDER — OXYTOCIN 40 UNITS IN NORMAL SALINE INFUSION - SIMPLE MED: 2.5000 [IU]/h | INTRAVENOUS | Status: AC

## 2018-08-20 MED ORDER — ZOLPIDEM TARTRATE 5 MG PO TABS: 5.0000 mg | ORAL_TABLET | Freq: Every evening | ORAL | Status: DC | PRN

## 2018-08-20 MED ORDER — SCOPOLAMINE 1 MG/3DAYS TD PT72
1.0000 | MEDICATED_PATCH | Freq: Once | TRANSDERMAL | Status: AC
  Administered 2018-08-20: 18:00:00 1.5 mg via TRANSDERMAL

## 2018-08-20 MED ORDER — PHENYLEPHRINE 40 MCG/ML (10ML) SYRINGE FOR IV PUSH (FOR BLOOD PRESSURE SUPPORT)
PREFILLED_SYRINGE | INTRAVENOUS | Status: AC
  Filled 2018-08-20: qty 10

## 2018-08-20 MED ORDER — LACTATED RINGERS IV SOLN
INTRAVENOUS | Status: DC
  Administered 2018-08-20: 23:00:00 via INTRAVENOUS

## 2018-08-20 MED ORDER — DIPHENHYDRAMINE HCL 50 MG/ML IJ SOLN: 12.5000 mg | INTRAMUSCULAR | Status: DC | PRN

## 2018-08-20 MED ORDER — NALOXONE HCL 0.4 MG/ML IJ SOLN: 0.4000 mg | INTRAMUSCULAR | Status: DC | PRN

## 2018-08-20 MED ORDER — TRANEXAMIC ACID-NACL 1000-0.7 MG/100ML-% IV SOLN
INTRAVENOUS | Status: AC
  Filled 2018-08-20: qty 100

## 2018-08-20 MED ORDER — TERBUTALINE SULFATE 1 MG/ML IJ SOLN
0.2500 mg | Freq: Once | INTRAMUSCULAR | Status: AC | PRN
  Administered 2018-08-20: 0.25 mg via SUBCUTANEOUS
  Filled 2018-08-20: qty 1

## 2018-08-20 MED ORDER — FENTANYL CITRATE (PF) 100 MCG/2ML IJ SOLN
100.0000 ug | Freq: Once | INTRAMUSCULAR | Status: AC
  Administered 2018-08-20: 19:00:00 100 ug via INTRAVENOUS

## 2018-08-20 MED ORDER — SIMETHICONE 80 MG PO CHEW
80.0000 mg | CHEWABLE_TABLET | ORAL | Status: DC
  Administered 2018-08-20 – 2018-08-23 (×3): 80 mg via ORAL
  Filled 2018-08-20 (×3): qty 1

## 2018-08-20 MED ORDER — SCOPOLAMINE 1 MG/3DAYS TD PT72
MEDICATED_PATCH | TRANSDERMAL | Status: AC
  Filled 2018-08-20: qty 1

## 2018-08-20 MED ORDER — SODIUM CHLORIDE 0.9 % IV SOLN
INTRAVENOUS | Status: DC | PRN
  Administered 2018-08-20: 40 [IU] via INTRAVENOUS

## 2018-08-20 MED ORDER — NALOXONE HCL 4 MG/10ML IJ SOLN
1.0000 ug/kg/h | INTRAVENOUS | Status: DC | PRN
  Filled 2018-08-20: qty 5

## 2018-08-20 MED ORDER — SODIUM CHLORIDE 0.9% FLUSH: 3.0000 mL | INTRAVENOUS | Status: DC | PRN

## 2018-08-20 MED ORDER — SODIUM CHLORIDE 0.9% IV SOLUTION: Freq: Once | INTRAVENOUS | Status: DC

## 2018-08-20 MED ORDER — SODIUM CHLORIDE 0.9 % IV SOLN
2.0000 g | INTRAVENOUS | Status: AC
  Administered 2018-08-20: 2 g via INTRAVENOUS
  Filled 2018-08-20: qty 2

## 2018-08-20 MED ORDER — ALBUTEROL SULFATE (2.5 MG/3ML) 0.083% IN NEBU: 2.5000 mg | INHALATION_SOLUTION | RESPIRATORY_TRACT | Status: DC | PRN

## 2018-08-20 MED ORDER — LACTATED RINGERS AMNIOINFUSION
INTRAVENOUS | Status: DC
  Administered 2018-08-20 (×2): via INTRAUTERINE

## 2018-08-20 MED ORDER — PROPOFOL 10 MG/ML IV BOLUS
INTRAVENOUS | Status: AC
  Filled 2018-08-20: qty 20

## 2018-08-20 MED ORDER — SIMETHICONE 80 MG PO CHEW: 80.0000 mg | CHEWABLE_TABLET | ORAL | Status: DC | PRN

## 2018-08-20 MED ORDER — GABAPENTIN 300 MG PO CAPS
300.0000 mg | ORAL_CAPSULE | Freq: Two times a day (BID) | ORAL | Status: DC
  Administered 2018-08-20 – 2018-08-23 (×6): 300 mg via ORAL
  Filled 2018-08-20 (×6): qty 1

## 2018-08-20 MED ORDER — FENTANYL CITRATE (PF) 100 MCG/2ML IJ SOLN
INTRAMUSCULAR | Status: AC
  Filled 2018-08-20: qty 2

## 2018-08-20 MED ORDER — PROPOFOL 10 MG/ML IV BOLUS
INTRAVENOUS | Status: DC | PRN
  Administered 2018-08-20 (×3): 20 mg via INTRAVENOUS

## 2018-08-20 MED ORDER — TRAMADOL HCL 50 MG PO TABS: 50.0000 mg | ORAL_TABLET | Freq: Four times a day (QID) | ORAL | Status: DC | PRN

## 2018-08-20 MED ORDER — OXYTOCIN 40 UNITS IN NORMAL SALINE INFUSION - SIMPLE MED
INTRAVENOUS | Status: AC
  Filled 2018-08-20: qty 1000

## 2018-08-20 MED ORDER — SODIUM CHLORIDE 0.9 % IV SOLN
INTRAVENOUS | Status: DC | PRN
  Administered 2018-08-20: 16:00:00 via INTRAVENOUS

## 2018-08-20 MED ORDER — SODIUM CHLORIDE 0.9 % IV SOLN
INTRAVENOUS | Status: AC
  Filled 2018-08-20: qty 500

## 2018-08-20 MED ORDER — IBUPROFEN 800 MG PO TABS
800.0000 mg | ORAL_TABLET | Freq: Four times a day (QID) | ORAL | Status: DC
  Administered 2018-08-21 – 2018-08-23 (×8): 800 mg via ORAL
  Filled 2018-08-20 (×8): qty 1

## 2018-08-20 MED ORDER — SODIUM CHLORIDE 0.9 % IV SOLN
INTRAVENOUS | Status: AC
  Filled 2018-08-20: qty 2

## 2018-08-20 MED ORDER — SODIUM BICARBONATE 8.4 % IV SOLN
INTRAVENOUS | Status: DC | PRN
  Administered 2018-08-20 (×5): 5 mL via EPIDURAL

## 2018-08-20 MED ORDER — SODIUM CHLORIDE 0.9 % IV SOLN
INTRAVENOUS | Status: DC | PRN
  Administered 2018-08-20: 60 ug/min via INTRAVENOUS

## 2018-08-20 MED ORDER — OXYTOCIN 40 UNITS IN NORMAL SALINE INFUSION - SIMPLE MED
1.0000 m[IU]/min | INTRAVENOUS | Status: DC
  Administered 2018-08-20: 05:00:00 2 m[IU]/min via INTRAVENOUS

## 2018-08-20 MED ORDER — OXYCODONE-ACETAMINOPHEN 5-325 MG PO TABS
1.0000 | ORAL_TABLET | ORAL | Status: DC | PRN
  Administered 2018-08-21 (×2): 1 via ORAL
  Filled 2018-08-20 (×3): qty 1

## 2018-08-20 MED ORDER — PHENYLEPHRINE 40 MCG/ML (10ML) SYRINGE FOR IV PUSH (FOR BLOOD PRESSURE SUPPORT)
PREFILLED_SYRINGE | INTRAVENOUS | Status: DC | PRN
  Administered 2018-08-20 (×8): 100 ug via INTRAVENOUS

## 2018-08-20 MED ORDER — MENTHOL 3 MG MT LOZG: 1.0000 | LOZENGE | OROMUCOSAL | Status: DC | PRN

## 2018-08-20 MED ORDER — PIPERACILLIN-TAZOBACTAM 3.375 G IVPB
3.3750 g | INTRAVENOUS | Status: AC
  Administered 2018-08-20: 3.375 g via INTRAVENOUS
  Filled 2018-08-20: qty 50

## 2018-08-20 MED ORDER — MIDAZOLAM HCL 2 MG/2ML IJ SOLN
INTRAMUSCULAR | Status: AC
  Filled 2018-08-20: qty 2

## 2018-08-20 MED ORDER — ONDANSETRON HCL 4 MG/2ML IJ SOLN
INTRAMUSCULAR | Status: DC | PRN
  Administered 2018-08-20: 4 mg via INTRAVENOUS

## 2018-08-20 MED ORDER — SODIUM CHLORIDE 0.9 % IV SOLN
500.0000 mg | INTRAVENOUS | Status: AC
  Administered 2018-08-20: 500 mg via INTRAVENOUS

## 2018-08-20 MED ORDER — TRANEXAMIC ACID-NACL 1000-0.7 MG/100ML-% IV SOLN
1000.0000 mg | INTRAVENOUS | Status: AC
  Administered 2018-08-20: 1000 mg via INTRAVENOUS

## 2018-08-20 SURGICAL SUPPLY — 35 items
CHLORAPREP W/TINT 26ML (MISCELLANEOUS) ×4 IMPLANT
CLAMP CORD UMBIL (MISCELLANEOUS) IMPLANT
CLOTH BEACON ORANGE TIMEOUT ST (SAFETY) ×4 IMPLANT
DRESSING PREVENA PLUS CUSTOM (GAUZE/BANDAGES/DRESSINGS) ×2 IMPLANT
DRSG OPSITE POSTOP 4X10 (GAUZE/BANDAGES/DRESSINGS) ×4 IMPLANT
DRSG PREVENA PLUS CUSTOM (GAUZE/BANDAGES/DRESSINGS) ×4
ELECT REM PT RETURN 9FT ADLT (ELECTROSURGICAL) ×4
ELECTRODE REM PT RTRN 9FT ADLT (ELECTROSURGICAL) ×2 IMPLANT
EXTRACTOR VACUUM M CUP 4 TUBE (SUCTIONS) IMPLANT
EXTRACTOR VACUUM M CUP 4' TUBE (SUCTIONS)
GLOVE BIOGEL PI IND STRL 7.0 (GLOVE) ×6 IMPLANT
GLOVE BIOGEL PI INDICATOR 7.0 (GLOVE) ×6
GLOVE ECLIPSE 7.0 STRL STRAW (GLOVE) ×4 IMPLANT
GOWN STRL REUS W/TWL LRG LVL3 (GOWN DISPOSABLE) ×8 IMPLANT
HEMOSTAT SURGICEL 2X3 (HEMOSTASIS) ×4 IMPLANT
KIT ABG SYR 3ML LUER SLIP (SYRINGE) IMPLANT
KIT PREVENA INCISION MGT20CM45 (CANNISTER) ×4 IMPLANT
NEEDLE HYPO 22GX1.5 SAFETY (NEEDLE) ×4 IMPLANT
NEEDLE HYPO 25X5/8 SAFETYGLIDE (NEEDLE) ×4 IMPLANT
NS IRRIG 1000ML POUR BTL (IV SOLUTION) ×4 IMPLANT
PACK C SECTION WH (CUSTOM PROCEDURE TRAY) ×4 IMPLANT
PAD ABD 7.5X8 STRL (GAUZE/BANDAGES/DRESSINGS) ×4 IMPLANT
PAD OB MATERNITY 4.3X12.25 (PERSONAL CARE ITEMS) ×4 IMPLANT
PENCIL SMOKE EVAC W/HOLSTER (ELECTROSURGICAL) ×4 IMPLANT
RTRCTR C-SECT PINK 25CM LRG (MISCELLANEOUS) IMPLANT
SPONGE LAP 18X18 X RAY DECT (DISPOSABLE) ×12 IMPLANT
SUT PDS AB 0 CTX 36 PDP370T (SUTURE) IMPLANT
SUT PLAIN 2 0 XLH (SUTURE) IMPLANT
SUT VIC AB 0 CTX 36 (SUTURE) ×4
SUT VIC AB 0 CTX36XBRD ANBCTRL (SUTURE) ×4 IMPLANT
SUT VIC AB 4-0 KS 27 (SUTURE) ×4 IMPLANT
SYR CONTROL 10ML LL (SYRINGE) ×4 IMPLANT
TOWEL OR 17X24 6PK STRL BLUE (TOWEL DISPOSABLE) ×4 IMPLANT
TRAY FOLEY W/BAG SLVR 14FR LF (SET/KITS/TRAYS/PACK) ×4 IMPLANT
WATER STERILE IRR 1000ML POUR (IV SOLUTION) ×4 IMPLANT

## 2018-08-20 NOTE — Progress Notes (Signed)
Kelli Barker is a 28 y.o. [id number] at [redacted]w[redacted]d admitted for IOL for postdates.  Subjective: Patient is still very uncomfortable with contractions despite epidural. Pitocin has been off due to fetal intolerance.   Objective: BP (!) 102/44   Pulse (!) 108   Temp 99.5 F (37.5 C) (Oral)   Resp (!) 22   Ht 5\' 2"  (1.575 m)   Wt 125.2 kg   LMP 11/19/2017 (Within Days)   SpO2 95%   BMI 50.48 kg/m  I/O last 3 completed shifts: In: -  Out: 500 [Urine:500] Total I/O In: 1547.3 [I.V.:1547.3] Out: 600 [Urine:600]  FHT:  FHR: 170 bpm, variability: moderate,  accelerations:  Abscent,  decelerations:  Present frequent late deceleration despite being off pitocin for hours IUPC:   regular, every 5 minutes; 125-150 MVU SVE:   Dilation: 7 Effacement (%): 70, 80 Station: -1, -2 Exam by:: Dr. Vanetta Shawl  Labs: CBC Latest Ref Rng & Units 08/19/2018 07/22/2018 05/29/2018  WBC 4.0 - 10.5 K/uL 8.0 - 9.4  Hemoglobin 12.0 - 15.0 g/dL 8.3(L) 9.8(A) 9.7(L)  Hematocrit 36.0 - 46.0 % 27.5(L) - 29.3(L)  Platelets 150 - 400 K/uL 216 - 254    Assessment / Plan: Induction of labor for post dates, now with fetal intolerance of labor.  Also had no cervical change for hours previously when she had adequate contractions.  Recommended cesarean delivery.  The risks of cesarean section were discussed with the patient including but were not limited to: bleeding which may require transfusion or reoperation; infection which may require antibiotics; injury to bowel, bladder, ureters or other surrounding organs; injury to the fetus; need for additional procedures including hysterectomy in the event of a life-threatening hemorrhage; placental abnormalities wth subsequent pregnancies, incisional problems, thromboembolic phenomenon and other postoperative/anesthesia complications.  Patient also desires permanent sterilization.  Other reversible forms of contraception were discussed with patient; she declines all other modalities. Risks  of procedure discussed with patient including but not limited to: risk of regret, permanence of method, bleeding, infection, injury to surrounding organs and need for additional procedures.  Failure risk of about 1-2% with increased risk of ectopic gestation if pregnancy occurs was also discussed with patient.  Also discussed possibility of post-tubal pain syndrome. The patient concurred with the proposed plan, giving informed written consent for the procedures. Anesthesia and OR teams aware.  Preoperative prophylactic antibiotics (Cefotetan and Azithromycin), TXA and SCDs ordered on call to the OR. She is typed and crossmatched for two units given her anemia with hemoglobin of 8.3. To OR when ready.   Verita Schneiders, MD, [place] [facility] 08/20/2018, 2:59 PM

## 2018-08-20 NOTE — Progress Notes (Signed)
Kelli Barker is a 28 y.o. [id number] at [redacted]w[redacted]d admitted for IOL for postdates.  Subjective: Patient is uncomfortable with contractions despite epidural. Pitocin has been off due to fetal intolerance. Rechecking patient for progress. IUPC was noted to not be in place.  Objective: BP (!) 103/43   Pulse (!) 115   Temp 99.5 F (37.5 C) (Oral)   Resp 18   Ht 5\' 2"  (1.575 m)   Wt 125.2 kg   LMP 11/19/2017 (Within Days)   SpO2 95%   BMI 50.48 kg/m  I/O last 3 completed shifts: In: -  Out: 500 [Urine:500] Total I/O In: 1108.3 [I.V.:1108.3] Out: 400 [Urine:400]  FHT:  FHR: 170 bpm, variability: moderate,  accelerations:  Abscent,  decelerations:  Present occasional late IUPC:   regular, every 5 minutes; 125 MVU, s/p terbn at 11:17am SVE:   Dilation: 7 Effacement (%): 70, 80 Station: -1, -2 Exam by:: Dr. Vanetta Shawl  IUPC replaced as it had fallen out of place, FSE also placed for better assessment.  Labs: Lab Results  Component Value Date   WBC 8.0 08/19/2018   HGB 8.3 (L) 08/19/2018   HCT 27.5 (L) 08/19/2018   MCV 76.0 (L) 08/19/2018   PLT 216 08/19/2018    Assessment / Plan: Induction of labor for post dates, stopped pitocin s/p terb 2/2 fetal intolerance. Currently patient had IUPC and FSE placed, unable to restart pitocin due to fetal intolerance to contractions. Will discuss labor plans with attending and patient.  Labor: Inadequate labor, unable to restart pitocin.  Preeclampsia:  N/A Fetal Wellbeing:  Category II Pain Control:  Epidural - calling anesthesia for support as patient uncomfortable I/D:  n/a Anticipated MOD:  Guarded, attempting NSVD  Katherine Basset, DO 08/20/2018, 2:28 PM

## 2018-08-20 NOTE — Discharge Summary (Signed)
Postpartum Discharge Summary     Patient Name: Kelli Barker DOB: [birth year] MRN: [id number]  Date of admission: 08/19/2018 Delivering Provider: Verita Schneiders A   Date of discharge: 08/23/2018  Admitting diagnosis: 30WKS WATER BROKE Intrauterine pregnancy: [redacted]w[redacted]d     Secondary diagnosis:  Principal Problem:   S/P cesarean section Active Problems:   Maternal morbid obesity, antepartum ([place])   Supervision of normal pregnancy   Late prenatal care in second trimester   Anemia in pregnancy, third trimester   Postoperative anemia due to acute blood loss  Additional problems: non-reassuring fetal status; arrest of cervical dilation     Discharge diagnosis: Term Pregnancy Delivered, Anemia and PPH                                                                                                Post partum procedures:None  Augmentation: Pitocin, Cytotec and Cook  Complications: Intra-operative anemia due to acute blood loss  Hospital course:  Induction of Labor With Cesarean Section  28 y.o. yo G5P5005 at [redacted]w[redacted]d was admitted to the hospital 08/19/2018 for induction of labor. Patient had a labor course significant for failed induction resulting in Cesarean section. The patient went for cesarean section due to Arrest of Dilation and Non-Reassuring FHR, and delivered a Viable infant,08/20/2018  Membrane Rupture Time/Date: 7:20 AM ,08/20/2018   Details of operation can be found in separate operative Note.  Patient had an uncomplicated postpartum course. She is ambulating, tolerating a regular diet, passing flatus, and urinating well.  Patient is discharged home in stable condition on 08/23/18.                                    Magnesium Sulfate recieved: No BMZ received: No  Physical exam  Vitals:   08/22/18 0453 08/22/18 1301 08/22/18 1941 08/23/18 0532  BP: (!) 105/57 133/60 (!) 117/59 (!) 114/55  Pulse: 85 79 86 74  Resp: 18 18 18 18   Temp: 98.3 F (36.8 C) 98.6 F (37 C) 98.4 F (36.9  C) 98 F (36.7 C)  TempSrc: Oral Oral Oral Oral  SpO2:      Weight:      Height:       General: alert, cooperative and no distress Lochia: appropriate Uterine Fundus: firm Incision: Dressing is clean, dry, and intact DVT Evaluation: No evidence of DVT seen on physical exam. Labs: Lab Results  Component Value Date   WBC 12.1 (H) 08/21/2018   HGB 7.2 (L) 08/21/2018   HCT 22.8 (L) 08/21/2018   MCV 76.8 (L) 08/21/2018   PLT 188 08/21/2018   CMP Latest Ref Rng & Units 08/21/2018  Glucose 70 - 99 mg/dL 93  BUN 6 - 20 mg/dL 5(L)  Creatinine 0.44 - 1.00 mg/dL 0.76  Sodium 135 - 145 mmol/L 135  Potassium 3.5 - 5.1 mmol/L 3.1(L)  Chloride 98 - 111 mmol/L 106  CO2 22 - 32 mmol/L 21(L)  Calcium 8.9 - 10.3 mg/dL 7.6(L)  Total Protein 6.5 - 8.1 g/dL 4.2(L)  Total Bilirubin  0.3 - 1.2 mg/dL 0.6  Alkaline Phos 38 - 126 U/L 113  AST 15 - 41 U/L 30  ALT 0 - 44 U/L 13    Discharge instruction: per After Visit Summary and "Baby and Me Booklet".  After visit meds:  Allergies as of 08/23/2018      Reactions   Sulfa Antibiotics    Child hood-unknown reaction      Medication List    TAKE these medications   acyclovir 400 MG tablet Commonly known as: ZOVIRAX Take 1 tablet (400 mg total) by mouth 3 (three) times daily.   albuterol 108 (90 Base) MCG/ACT inhaler Commonly known as: VENTOLIN HFA Inhale 2 puffs every 4 (four) hours as needed into the lungs for wheezing or shortness of breath.   Blood Pressure Monitor Misc For regular home bp monitoring during pregnancy   calcium carbonate 750 MG chewable tablet Commonly known as: TUMS EX Chew 1 tablet by mouth as needed.   ferrous sulfate 325 (65 FE) MG tablet Take 1 tablet (325 mg total) by mouth 2 (two) times daily with a meal. What changed: Another medication with the same name was added. Make sure you understand how and when to take each.   ferrous sulfate 324 (65 Fe) MG Tbec Take 1 tablet (325 mg total) by mouth 2 (two) times  daily. What changed: You were already taking a medication with the same name, and this prescription was added. Make sure you understand how and when to take each.   ibuprofen 600 MG tablet Commonly known as: ADVIL Take 1 tablet (600 mg total) by mouth every 6 (six) hours.   multivitamin-prenatal 27-0.8 MG Tabs tablet Take 1 tablet by mouth daily at 12 noon.   oxyCODONE-acetaminophen 5-325 MG tablet Commonly known as: PERCOCET/ROXICET Take 1 tablet by mouth every 4 (four) hours as needed for moderate pain.   pantoprazole 20 MG tablet Commonly known as: PROTONIX Take 1 tablet (20 mg total) by mouth daily.   senna-docusate 8.6-50 MG tablet Commonly known as: Senokot-S Take 2 tablets by mouth daily. Start taking on: August 24, 2018       Diet: carb modified diet  Activity: Advance as tolerated. Pelvic rest for 6 weeks.   Outpatient follow up:4 weeks Follow up Appt: Future Appointments  Date Time Provider Department Center  08/27/2018 10:30 AM Tilda BurrowFerguson, John V, MD [facility]  09/16/2018 10:00 AM Tilda BurrowFerguson, John V, MD [facility]  09/24/2018 10:50 AM Cresenzo-Dishmon, Scarlette CalicoFrances, CNM [facility]   Follow up Visit:   Please schedule this patient for Postpartum visit in: 4 weeks with the following provider: MD For C/S patients schedule nurse incision check in weeks 2 weeks: Please schedule in 1 week due to Prevena  Low risk pregnancy complicated by failed induction.  Delivery mode:  CS Anticipated Birth Control:  BTL done PP PP Procedures needed: None Schedule Integrated BH visit: no      Newborn Data: Live born female  Birth Weight: 10 lb 1.4 oz (4575 g) APGAR: 9, 9  Newborn Delivery   Birth date/time: 08/20/2018 16:06:00 Delivery type: C-Section, Low Transverse Trial of labor: Yes C-section categorization: Primary      Baby Feeding: Breast Disposition:home with mother   Mart PiggsAlicia Firestone, MD PGY-3   OB FELLOW DISCHARGE ATTESTATION  I have seen and  examined this patient and agree with above documentation in the resident's note.   Marcy Sirenatherine Maitland Muhlbauer, D.O. OB Fellow  08/23/2018, 9:06 AM

## 2018-08-20 NOTE — Op Note (Signed)
Kelli Barker PROCEDURE DATE: 08/20/2018  PREOPERATIVE DIAGNOSES: Intrauterine pregnancy at 7179w1d weeks gestation; non-reassuring fetal status; arrest of cervical dilation; morbid obesity; maternal anemia with hemoglobin 8.3; undesired fertility  POSTOPERATIVE DIAGNOSES: The same  PROCEDURE: Primary Low Transverse Cesarean Section, Bilateral Tubal Sterilization using Pomeroy method  SURGEON:  Dr. Jaynie CollinsUgonna Judaea Burgoon  ASSISTANT:  Dr. Jen MowElizabeth Mumaw and Dr. Jerilynn Birkenheadhelsea Fair  ANESTHESIOLOGIST: Dr. Willette Almahelsey Woodrum  INDICATIONS: Kelli Barker is a 28 y.o. [id number] at 5779w1d here for cesarean section and bilateral tubal sterilization secondary to the indications listed under preoperative diagnoses; please see preoperative note for further details.  The risks of surgery were discussed with the patient including but were not limited to: bleeding which may require transfusion or reoperation; infection which may require antibiotics; injury to bowel, bladder, ureters or other surrounding organs; injury to the fetus; need for additional procedures including hysterectomy in the event of a life-threatening hemorrhage; placental abnormalities wth subsequent pregnancies, incisional problems, thromboembolic phenomenon and other postoperative/anesthesia complications.  Patient also desires permanent sterilization.  Other reversible forms of contraception were discussed with patient; she declines all other modalities. Risks of procedure discussed with patient including but not limited to: risk of regret, permanence of method, bleeding, infection, injury to surrounding organs and need for additional procedures.  Failure risk of 1-2% with increased risk of ectopic gestation if pregnancy occurs was also discussed with patient.  Also discussed possibility of post-tubal pain syndrome. The patient concurred with the proposed plan, giving informed written consent for the procedures.    FINDINGS:  Viable female infant in cephalic  presentation.  Apgars 9 and 9, weight 10 lb 1 oz.  Moderate meconium in amniotic fluid.  Intact placenta, three vessel cord.  Edematous lower uterine segment noted prior to hysterotomy. There was an extension of hysterotomy to bilateral vessels, also significant uterine atony after birth led to increased blood loss.  Transfusion done in operating room due to preoperative anemia and acute blood loss.  Normal uterus, fallopian tubes and ovaries bilaterally. Fallopian tubes were sterilized bilaterally in the Pomeroy fashion.  ANESTHESIA: Epidural INTRAVENOUS FLUIDS: 3000 ml and 2 units of pRBCs ESTIMATED BLOOD LOSS: 1500 ml URINE OUTPUT:  500 ml SPECIMENS: Placenta and bilateral fallopian tube fragments also sent to pathology COMPLICATIONS: Extension of hysterotomy to bilateral vessels, also significant uterine atony after birth led to increased blood loss.  PROCEDURE IN DETAIL:  The patient preoperatively received intravenous antibiotics and had sequential compression devices applied to her lower extremities.   She was then taken to the operating room where the epidural anesthesia was dosed up to surgical level and was found to be adequate. She was then placed in a dorsal supine position with a leftward tilt, and prepped and draped in a sterile manner.  A foley catheter was placed into her bladder and attached to constant gravity.  After an adequate timeout was performed, a Pfannenstiel skin incision was made with scalpel and carried through to the underlying layer of fascia. The fascia was incised in the midline, and this incision was extended bilaterally using the Mayo scissors.  Kocher clamps were applied to the superior aspect of the fascial incision and the underlying rectus muscles were dissected off bluntly.  A similar process was carried out on the inferior aspect of the fascial incision. The rectus muscles were separated in the midline and the peritoneum was entered bluntly. The Alexis self-retaining  retractor was introduced into the abdominal cavity.  Attention was turned to the lower uterine segment where  a low transverse hysterotomy was made with a scalpel and extended bilaterally bluntly.  The infant was successfully delivered, the cord was clamped and cut after one minute, and the infant was handed over to the awaiting neonatology team. Uterine massage was then administered, and the placenta delivered intact with a three-vessel cord. There was significant bleeding due to aforementioned factors, uterotonics were given. Transfusion was also given with two units of pRBCs. The uterus was then cleared of clots and debris.  The hysterotomy was closed with 0 Vicryl in a running locked fashion, and an imbricating layer was also placed with 0 Vicryl. Figure-of-eight 0 Vicryl serosal stitches were placed to help with hemostasis; O'Leary stitches were placed at the bilateral ends of the hysterotomy given the increased bleeding.  Surgicel was placed over the repaired hysterotomy.  Attention was then turned to the fallopian tubes. The Babcock clamp was then used to grasp the left fallopian tube approximately 3 cm from the cornual region. A 2 cm segment of the tube was then doubly ligated with free tie of plain gut suture, transected and excised. Good hemostasis was noted. The right fallopian tube was then identified, doubly ligated, and a 2 cm segment excised in a similar fashion allowing for bilateral tubal sterilization.  Good hemostasis was noted. The pelvis was cleared of all clot and debris. Hemostasis was confirmed on all surfaces.  The retractor was removed.  The peritoneum and the rectus muscles were reapproximated using 0 Vicryl interrupted stitches. The fascia was then closed using 0 PDS in a running fashion.  The subcutaneous layer was irrigated, reapproximated with 2-0 plain gut interrupted stitches, and the skin was closed with a 4-0 Vicryl subcuticular stitch. After the skin was closed, a Prevena  disposable negative pressure wound therapy device was placed over the incision.  The suction was activated at a pressure of 80 mmHg.  The adhesive was affixed well and there were no leaks noted. The patient tolerated the procedure well. Sponge, instrument and needle counts were correct x 3.  She was taken to the recovery room in stable condition.    Verita Schneiders, MD, [place] [facility] [employer], [place]

## 2018-08-20 NOTE — Progress Notes (Signed)
Still having variables. Position changed to left lateral. Dr. Particia Lather still in [place]. Per Dr. Ilda Basset will give Terb. Her will come assess pt.   Tamala Julian, [place], [place] 08/20/2018 11:17 AM

## 2018-08-20 NOTE — Anesthesia Procedure Notes (Signed)
Epidural Patient location during procedure: OB Start time: 08/20/2018 4:29 AM End time: 08/20/2018 4:37 AM  Staffing Anesthesiologist: Josephine Igo, MD  Preanesthetic Checklist Completed: patient identified, site marked, surgical consent, pre-op evaluation, timeout performed, IV checked, risks and benefits discussed and monitors and equipment checked  Epidural Patient position: sitting Prep: site prepped and draped and DuraPrep Patient monitoring: continuous pulse ox and blood pressure Approach: midline Location: L3-L4 Injection technique: LOR air  Needle:  Needle type: Tuohy  Needle gauge: 17 G Needle length: 9 cm and 9 Needle insertion depth: 5 cm cm Catheter type: closed end flexible Catheter size: 19 Gauge Catheter at skin depth: 10 cm Test dose: negative and Other  Assessment Events: blood not aspirated, injection not painful, no injection resistance, negative IV test and no paresthesia  Additional Notes Patient identified. Risks and benefits discussed including failed block, incomplete  Pain control, post dural puncture headache, nerve damage, paralysis, blood pressure Changes, nausea, vomiting, reactions to medications-both toxic and allergic and post Partum back pain. All questions were answered. Patient expressed understanding and wished to proceed. Sterile technique was used throughout procedure. Epidural site was Dressed with sterile barrier dressing. No paresthesias, signs of intravascular injection Or signs of intrathecal spread were encountered.  Patient was more comfortable after the epidural was dosed. Please see RN's note for documentation of vital signs and FHR which are stable. Reason for block:procedure for pain

## 2018-08-20 NOTE — Progress Notes (Signed)
OB/GYN Faculty Practice: Labor Progress Note  Subjective: Patient doing well without complaints.   Objective: BP 96/80   Pulse 80   Temp 98.5 F (36.9 C) (Oral)   Resp 18   Ht 5\' 2"  (1.575 m)   Wt 125.2 kg   LMP 11/19/2017 (Within Days)   BMI 50.48 kg/m  Gen: well appearing, NAD Dilation: Fingertip Effacement (%): 50 Cervical Position: Posterior Station: -2 Presentation: Vertex Exam by:: [employer]: baseline 150bpm, mod variability, +accels, no decels Toco: q1-2 minutes  Assessment and Plan: 28 y.o. [id number] [redacted]w[redacted]d IOL-PD.  Labor: progressing well. S/p cytotec x2. Cook catheter placed and uterine balloon inflated with 80cc NS. Risks/benefits discussed with patient and patient tolerated procedure well. Will initiate pit @0330 . Continue time appropriate cervical exams. -- pain control: well controlled currently, desires epidural -- PPH Risk: low  Fetal Well-Being: EFW 1229g (100%ile) at 27w u/s. Cephalic by cervical exam.  -- Category 1 - continuous fetal monitoring  -- GBS negative  HSV --no lesions --on valtrex supression  Fetal macrosomia --EFW as noted above  Maternal anemia --CBC on admit showed hgb 8.3 --post partum cbc  Hx of anxiety/depression --husband has stage 4 lymphoma --not on meds --monitor for post partum depression  Outpatient BTL --papers signed --pre op scheduled   Corliss Blacker, PGY-III Family Medicine 2:02 AM

## 2018-08-20 NOTE — Anesthesia Preprocedure Evaluation (Signed)
Anesthesia Evaluation  Patient identified by MRN, date of birth, ID band Patient awake    Reviewed: Allergy & Precautions, Patient's Chart, lab work & pertinent test results  Airway Mallampati: III  TM Distance: >3 FB Neck ROM: Full    Dental no notable dental hx. (+) Teeth Intact   Pulmonary asthma , former smoker,    Pulmonary exam normal breath sounds clear to auscultation       Cardiovascular negative cardio ROS Normal cardiovascular exam Rhythm:Regular Rate:Normal     Neuro/Psych PSYCHIATRIC DISORDERS Anxiety Depression negative neurological ROS     GI/Hepatic Neg liver ROS, GERD  Controlled and Medicated,  Endo/Other  Morbid obesity  Renal/GU negative Renal ROS  negative genitourinary   Musculoskeletal negative musculoskeletal ROS (+)   Abdominal (+) + obese,   Peds  Hematology  (+) anemia ,   Anesthesia Other Findings   Reproductive/Obstetrics (+) Pregnancy                             Anesthesia Physical Anesthesia Plan  ASA: III  Anesthesia Plan: Epidural   Post-op Pain Management:    Induction:   PONV Risk Score and Plan:   Airway Management Planned: Natural Airway  Additional Equipment:   Intra-op Plan:   Post-operative Plan:   Informed Consent: I have reviewed the patients History and Physical, chart, labs and discussed the procedure including the risks, benefits and alternatives for the proposed anesthesia with the patient or authorized representative who has indicated his/her understanding and acceptance.       Plan Discussed with: Anesthesiologist  Anesthesia Plan Comments:         Anesthesia Quick Evaluation

## 2018-08-20 NOTE — Transfer of Care (Signed)
Immediate Anesthesia Transfer of Care Note  Patient: Kelli Barker  Procedure(s) Performed: CESAREAN SECTION (N/A Abdomen) BILATERAL TUBAL LIGATION (Abdomen)  Patient Location: PACU  Anesthesia Type:Epidural  Level of Consciousness: awake, alert  and oriented  Airway & Oxygen Therapy: Patient Spontanous Breathing and Patient connected to nasal cannula oxygen  Post-op Assessment: Report given to RN and Post -op Vital signs reviewed and stable  Post vital signs: Reviewed and stable  Last Vitals:  Vitals Value Taken Time  BP 107/58 08/20/18 1730  Temp    Pulse 102 08/20/18 1731  Resp 22 08/20/18 1731  SpO2 100 % 08/20/18 1731  Vitals shown include unvalidated device data.  Last Pain:  Vitals:   08/20/18 1434  TempSrc:   PainSc: Asleep         Complications: No apparent anesthesia complications

## 2018-08-20 NOTE — Progress Notes (Signed)
Kimberlie Csaszar is a 28 y.o. [id number] at [redacted]w[redacted]d.  Subjective: Very uncomfortable w/ UC's. No change in location of nature of pain since exam 30 minutes ago. Anesthesia at [facility] dosing epidural.   Objective: BP 102/63   Pulse 95   Temp 99.3 F (37.4 C) (Oral)   Resp 18   Ht 5\' 2"  (1.575 m)   Wt 125.2 kg   LMP 11/19/2017 (Within Days)   SpO2 97%   BMI 50.48 kg/m    FHT:  FHR: 160 bpm, variability: mod,  accelerations:  10x10,  decelerations:  Repetitive variables returned.  UC:   Q 3-4 minutes, strong Dilation: 7 Effacement (%): 70, 80 Cervical Position: Middle Station: -1, -2 Presentation: Vertex Exam by:: Dr. Vanetta Shawl  Labs: Results for orders placed or performed during the hospital encounter of 08/19/18 (from the past 24 hour(s))  CBC     Status: Abnormal   Collection Time: 08/19/18  1:05 PM  Result Value Ref Range   WBC 8.0 4.0 - 10.5 K/uL   RBC 3.62 (L) 3.87 - 5.11 MIL/uL   Hemoglobin 8.3 (L) 12.0 - 15.0 g/dL   HCT 27.5 (L) 36.0 - 46.0 %   MCV 76.0 (L) 80.0 - 100.0 fL   MCH 22.9 (L) 26.0 - 34.0 pg   MCHC 30.2 30.0 - 36.0 g/dL   RDW 16.7 (H) 11.5 - 15.5 %   Platelets 216 150 - 400 K/uL   nRBC 0.4 (H) 0.0 - 0.2 %  RPR     Status: None   Collection Time: 08/19/18  1:05 PM  Result Value Ref Range   RPR Ser Ql Non Reactive Non Reactive  Type and screen [place]     Status: None (Preliminary result)   Collection Time: 08/19/18  1:06 PM  Result Value Ref Range   ABO/RH(D) AB POS    Antibody Screen NEG    Sample Expiration      08/22/2018,2359 Performed at [facility], [address] [place], [place] [address]    Unit Number [id number]    Blood Component Type RED CELLS,LR    Unit division 00    Status of Unit ALLOCATED    Transfusion Status OK TO TRANSFUSE    Crossmatch Result Compatible    Unit Number [id number]    Blood Component Type RED CELLS,LR    Unit division 00    Status of Unit ALLOCATED    Transfusion Status OK TO  TRANSFUSE    Crossmatch Result Compatible   ABO/Rh     Status: None   Collection Time: 08/19/18  1:06 PM  Result Value Ref Range   ABO/RH(D)      AB POS Performed at [facility], [place] [address], [place], [place] [address]   Prepare RBC     Status: None   Collection Time: 08/20/18  9:16 AM  Result Value Ref Range   Order Confirmation      ORDER PROCESSED BY BLOOD BANK Performed at [facility], [place] [address], [place],  [address]     Assessment / Plan: [redacted]w[redacted]d week IUP Labor: Transition.  Fetal Wellbeing:  Category II, but overall reassuring  Pain Control:  Epidural not providing adequate pain relief. Being re-dosed.  Anticipated MOD:  SVD Start Amnioinfusion. Keep pitocin off for now. Trying to keep pt of right side due to thicker cervix on that side, but may need to try other position changes in variables do not improve w/ amnioinfusion.  Katrinka BlazingSmith, [place], CNM 08/20/2018 11:13 AM

## 2018-08-20 NOTE — Progress Notes (Signed)
Kelli Barker is a 28 y.o. U4Q0347 at [redacted]w[redacted]d admitted for induction of labor due to Post dates.  Subjective: Patient feeling uncomfortable with urge to push. Reports pain worse in left leg. Hesitant to make position changes; declines reassessment by Anesthesia.   Objective: BP (!) 113/58   Pulse (!) 108   Temp 99.7 F (37.6 C) (Oral)   Resp 18   Ht 5\' 2"  (1.575 m)   Wt 125.2 kg   LMP 11/19/2017 (Within Days)   SpO2 95%   BMI 50.48 kg/m  I/O last 3 completed shifts: In: -  Out: 500 [Urine:500] No intake/output data recorded.  FHT:  FHR: 150 bpm, variability: moderate,  accelerations:  Present,  Recurrent variables with contractions UC:   regular, 2-3/10 SVE:   Dilation: 7 Effacement (%): 70, 80 Station: -1, -2  Labs: Lab Results  Component Value Date   WBC 8.0 08/19/2018   HGB 8.3 (L) 08/19/2018   HCT 27.5 (L) 08/19/2018   MCV 76.0 (L) 08/19/2018   PLT 216 08/19/2018    Assessment / Plan: Labor: Pitocin stopped due to variables and IUPC placed; will consider possible amnioinfusion if variables persist after pitocin stopped  Fetal Wellbeing:  Category II Pain Control:  Epidural Anticipated MOD:  NSVD  Sri Clegg N Camile Esters 08/20/2018, 12:37 PM

## 2018-08-20 NOTE — Progress Notes (Signed)
OB/GYN Faculty Practice: Labor Progress Note  Subjective: Patient doing well without complaints.   Objective: BP (!) 100/52   Pulse 83   Temp 98.4 F (36.9 C) (Oral)   Resp 18   Ht 5\' 2"  (1.575 m)   Wt 125.2 kg   LMP 11/19/2017 (Within Days)   SpO2 97%   BMI 50.48 kg/m  Gen: well appearing, NAD Dilation: 6 Effacement (%): 80 Cervical Position: Middle Station: -3, -2 Presentation: Vertex Exam by:: Janann August, RN  FHT: baseline 140bpm, mod variability, +accels, no decels Toco: q2-3 minutes  Assessment and Plan: 28 y.o. [id number] [redacted]w[redacted]d IOL-PD.  Labor: progressing well. S/p cytotec x2 and  Cook catheter. Pit @2 . Continue time appropriate cervical exams. -- pain control: CSE -- PPH Risk: low  Fetal Well-Being: EFW 1229g (100%ile) at 27w u/s. Cephalic by cervical exam.  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

## 2018-08-20 NOTE — Progress Notes (Signed)
CTSP re: fetal decelerations.  Pitocin already turned off and tracing looking better with AI ongoing and contractions q3-89m. I ordered a dose of terbutaline  EFM category I and had an accel and pt in LLR position when I came to see her. SVE about an hour ago and unchanged at 7cm. Having issues with getting epidural to work well. D/w her that recommend letting fetus recover and if recheck in one hour. If unchanged, will have d/w her re: continuing with pitocin augmentation and seeing if fetus tolerate this vs consideration for a c-section since she was 6cm at 0500 today with SROM at 0730 and pitocin going during this time. Concern for asynclitic, etc given pain with epidural.  Durene Romans MD Attending [facility] for [place] (Faculty Practice) 08/20/2018 Time: 1130am

## 2018-08-21 LAB — TYPE AND SCREEN
ABO/RH(D): AB POS
Antibody Screen: NEGATIVE
Unit division: 0
Unit division: 0

## 2018-08-21 LAB — BPAM RBC
Blood Product Expiration Date: 202008182359
Blood Product Expiration Date: 202008212359
ISSUE DATE / TIME: 202008061619
ISSUE DATE / TIME: 202008061619
Unit Type and Rh: 8400
Unit Type and Rh: 8400

## 2018-08-21 LAB — COMPREHENSIVE METABOLIC PANEL
ALT: 13 U/L (ref 0–44)
AST: 30 U/L (ref 15–41)
Albumin: 1.8 g/dL — ABNORMAL LOW (ref 3.5–5.0)
Alkaline Phosphatase: 113 U/L (ref 38–126)
Anion gap: 8 (ref 5–15)
BUN: 5 mg/dL — ABNORMAL LOW (ref 6–20)
CO2: 21 mmol/L — ABNORMAL LOW (ref 22–32)
Calcium: 7.6 mg/dL — ABNORMAL LOW (ref 8.9–10.3)
Chloride: 106 mmol/L (ref 98–111)
Creatinine, Ser: 0.76 mg/dL (ref 0.44–1.00)
GFR calc Af Amer: >60 mL/min (ref >60)
GFR calc non Af Amer: >60 mL/min (ref >60)
Glucose, Bld: 93 mg/dL (ref 70–99)
Potassium: 3.1 mmol/L — ABNORMAL LOW (ref 3.5–5.1)
Sodium: 135 mmol/L (ref 135–145)
Total Bilirubin: 0.6 mg/dL (ref 0.3–1.2)
Total Protein: 4.2 g/dL — ABNORMAL LOW (ref 6.5–8.1)

## 2018-08-21 LAB — CBC
HCT: 22.8 % — ABNORMAL LOW (ref 36.0–46.0)
Hemoglobin: 7.2 g/dL — ABNORMAL LOW (ref 12.0–15.0)
MCH: 24.2 pg — ABNORMAL LOW (ref 26.0–34.0)
MCHC: 31.6 g/dL (ref 30.0–36.0)
MCV: 76.8 fL — ABNORMAL LOW (ref 80.0–100.0)
Platelets: 188 10*3/uL (ref 150–400)
RBC: 2.97 MIL/uL — ABNORMAL LOW (ref 3.87–5.11)
RDW: 16.5 % — ABNORMAL HIGH (ref 11.5–15.5)
WBC: 12.1 10*3/uL — ABNORMAL HIGH (ref 4.0–10.5)
nRBC: 0.3 % — ABNORMAL HIGH (ref 0.0–0.2)

## 2018-08-21 MED ORDER — SODIUM CHLORIDE 0.9 % IV SOLN
510.0000 mg | Freq: Once | INTRAVENOUS | Status: AC
  Administered 2018-08-21: 510 mg via INTRAVENOUS
  Filled 2018-08-21: qty 17

## 2018-08-21 NOTE — Progress Notes (Signed)
Post-Op Day 1, primary CS for NRFHT and arrest of descent  Subjective: No complaints, up ad lib, voiding and tolerating PO, passing flatus,small lochia,.plans to breastfeed, Had BTL  Objective: Blood pressure (!) 104/57, pulse 86, temperature 98 F (36.7 C), temperature source Oral, resp. rate 18, height 5\' 2"  (1.575 m), weight 125.2 kg, last menstrual period 11/19/2017, SpO2 96 %, unknown if currently breastfeeding.  Physical Exam:  General: alert, cooperative and no distress Lochia:normal flow Chest: CTAB Heart: RRR no m/r/g Abdomen: +BS, soft, nontender, dsg c/d/intact, no erythema Uterine Fundus: firm DVT Evaluation: No evidence of DVT seen on physical exam. Extremities: trace edema  Recent Labs    08/20/18 1817 08/21/18 0427  HGB 8.9* 7.2*  HCT 28.1* 22.8*    Assessment/Plan: Anemia; give fereheme today   LOS: 2 days   Christin Fudge 08/21/2018, 7:21 AM

## 2018-08-21 NOTE — Anesthesia Postprocedure Evaluation (Signed)
Anesthesia Post Note  Patient: Kelli Barker  Procedure(s) Performed: CESAREAN SECTION (N/A Abdomen) BILATERAL TUBAL LIGATION (Abdomen)     Patient location during evaluation: PACU Anesthesia Type: Epidural Level of consciousness: oriented and awake and alert Pain management: pain level controlled Vital Signs Assessment: post-procedure vital signs reviewed and stable Respiratory status: spontaneous breathing, respiratory function stable and patient connected to nasal cannula oxygen Cardiovascular status: blood pressure returned to baseline and stable Postop Assessment: no headache, no backache and no apparent nausea or vomiting Anesthetic complications: no    Last Vitals:  Vitals:   08/21/18 0633 08/21/18 0949  BP: (!) 104/57 (!) 93/51  Pulse: 86 88  Resp: 18 18  Temp: 36.7 C 36.8 C  SpO2: 96% 97%    Last Pain:  Vitals:   08/21/18 1412  TempSrc:   PainSc: 0-No pain                 Kelli Barker

## 2018-08-21 NOTE — Lactation Note (Addendum)
This note was copied from a baby's chart. Lactation Consultation Note Baby 61 hrs old. Mom's 5th child. Mom BF her last child for 1 months 8 yrs ago. Mom has "V" shaped breast w/everted nipples. Mom attempting to latch in cradle position saying it hurts. [place] repositioned baby to football hold. Baby latched well. Heard a few swallows. Baby looks slightly jaundice. Spoke w/RN, levels 5.9 at 12 hrs old.  Baby is big, discussed positions, support, latching, breast massage, supply and demand. Newborn behavior, STS, I&O discussed. Answered a few questions mom had. Lactation brochure given.   Patient Name: Kelli Barker [id number] Date: 08/21/2018 Reason for consult: Initial assessment;Term   Maternal Data Has patient been taught Hand Expression?: Yes Does the patient have breastfeeding experience prior to this delivery?: Yes  Feeding Feeding Type: Breast Fed  LATCH Score Latch: Grasps breast easily, tongue down, lips flanged, rhythmical sucking.  Audible Swallowing: A few with stimulation  Type of Nipple: Everted at rest and after stimulation  Comfort (Breast/Nipple): Soft / non-tender  Hold (Positioning): Assistance needed to correctly position infant at breast and maintain latch.  LATCH Score: 8  Interventions Interventions: Breast feeding basics reviewed;Adjust position;Assisted with latch;Support pillows;Skin to skin;Position options;Breast massage;Hand express;Breast compression  Lactation Tools Discussed/Used WIC Program: Yes   Consult Status Consult Status: Follow-up Date: 08/22/18 Follow-up type: In-patient    Theodoro Kalata 08/21/2018, 6:37 AM

## 2018-08-21 NOTE — Lactation Note (Signed)
This note was copied from a baby's chart. Lactation Consultation Note  Patient Name: Kelli Barker [id number] Date: 08/21/2018 Reason for consult: Term;Infant weight loss  Baby is 90 hours old  Baby latched as LC entered the room. Per mom feeling alittle with latch.  LC showed mom breast compressions and it improved and more swallows.  Baby still feeding at 15 mins.  LC discussed nutritive vs non- nutritive feeding patterns and the importance of watching the baby for hanging out latched.  Also showed mom breast compressions to enhance flow.  Mom will need a hand pump prior D/C tomorrow.    Maternal Data Has patient been taught Hand Expression?: Yes Does the patient have breastfeeding experience prior to this delivery?: Yes  Feeding Feeding Type: (baby latched with depth - swallows)  LATCH Score Latch: (latched with depth)  Audible Swallowing: (swallows noted)  Type of Nipple: Everted at rest and after stimulation  Comfort (Breast/Nipple): (pe rmom more comfortable after LC showed her breast compressions)  Hold (Positioning): No assistance needed to correctly position infant at breast.  LATCH Score: 9  Interventions Interventions: Breast feeding basics reviewed;Assisted with latch;Skin to skin;Breast compression  Lactation Tools Discussed/Used     Consult Status Consult Status: Follow-up Date: 08/22/18 Follow-up type: In-patient    [place] 08/21/2018, 11:46 AM

## 2018-08-21 NOTE — Progress Notes (Signed)
I attempted to check in on family to offer support as FOB is undergoing chemotherapy.  Kelli Barker was eating and seemed in good spirits.  I offered to come back at a later time, which I did, but she was resting.  [place], [place] Pager, [phone] 4:39 PM    08/21/18 1600  Clinical Encounter Type  Visited With Patient  Visit Type Initial

## 2018-08-21 NOTE — Progress Notes (Signed)
CSW received consult for history of anxiety and depression.  CSW met with MOB to offer support and complete assessment.    MOB laying in bed performing skin-to-skin with infant, when CSW entered the room. CSW introduced self and explained reason for consult to which MOB was understanding. MOB was pleasant and easy to engage throughout assessment. CSW inquired about MOB's mental health history and MOB acknowledged being diagnosed with depression at age 28 or 45 and anxiety at age 65 or 59. MOB denied any recent symptoms but did express that she felt stressed during her pregnancy but did not feel it was unmanageable. MOB denied any current medications and stated last time she was in counseling was about 4 years ago. MOB acknowledged experiencing some minor PPD with her oldest child but none with her other pregnancies but was receptive to education. CSW provided education regarding the baby blues period vs. perinatal mood disorders, discussed treatment and gave resources for mental health follow up if concerns arise.  CSW recommends self-evaluation during the postpartum time period using the New Mom Checklist from Postpartum Progress and encouraged MOB to contact a medical professional if symptoms are noted at any time. MOB denied any current SI, HI or DV and reported having a good support system consisting of FOB, FOB's family, her daughter's father and her daughter's father's family. MOB did not appear to be displaying any acute mental health symptoms and reported currently feeling good just tired.  MOB confirmed having all essential items for infant once discharged and reoprted infant would be sleeping in a basinet once home. CSW provided review of Sudden Infant Death Syndrome (SIDS) precautions and safe sleeping habits.    CSW identifies no further need for intervention and no barriers to discharge at this time.  Kelli Barker, [place]  [facility] [employer] [phone]

## 2018-08-21 NOTE — Progress Notes (Signed)
Pt called out for RN assistance. When RN entered the room, pt was visibly tachypneic in bed with baby STS. She stated she had been asleep then "woke up and couldn't catch my breath and kept gasping". HOB elevated by RN, spO2 99%, VSS, lungs sounds clear bilaterally. Pt denies a history of sleep apnea and denies similar episodes in the past. After 2 minutes of deep breathing, tachypnea resolved. 2000 achieved on incentive spirometer. Pt's primary nurse updated.  Gearldine Bienenstock, RN 08/21/2018 1:02 AM

## 2018-08-22 ENCOUNTER — Encounter (HOSPITAL_COMMUNITY): Payer: Self-pay | Admitting: Obstetrics & Gynecology

## 2018-08-22 NOTE — Plan of Care (Signed)
  Problem: Clinical Measurements: Goal: Ability to maintain clinical measurements within normal limits will improve Outcome: Completed/Met Goal: Will remain free from infection Outcome: Completed/Met Goal: Diagnostic test results will improve Outcome: Completed/Met Goal: Respiratory complications will improve Outcome: Completed/Met Goal: Cardiovascular complication will be avoided Outcome: Completed/Met   Problem: Activity: Goal: Risk for activity intolerance will decrease Outcome: Completed/Met   Problem: Nutrition: Goal: Adequate nutrition will be maintained Outcome: Completed/Met   Problem: Coping: Goal: Level of anxiety will decrease Outcome: Completed/Met   Problem: Elimination: Goal: Will not experience complications related to bowel motility Outcome: Completed/Met Goal: Will not experience complications related to urinary retention Outcome: Completed/Met   Problem: Pain Managment: Goal: General experience of comfort will improve Outcome: Completed/Met   Problem: Safety: Goal: Ability to remain free from injury will improve Outcome: Completed/Met   Problem: Skin Integrity: Goal: Risk for impaired skin integrity will decrease Outcome: Completed/Met   Problem: Activity: Goal: Will verbalize the importance of balancing activity with adequate rest periods Outcome: Completed/Met Goal: Ability to tolerate increased activity will improve Outcome: Completed/Met   Problem: Life Cycle: Goal: Chance of risk for complications during the postpartum period will decrease Outcome: Completed/Met   Problem: Role Relationship: Goal: Ability to demonstrate positive interaction with newborn will improve Outcome: Completed/Met   Problem: Skin Integrity: Goal: Demonstration of wound healing without infection will improve Outcome: Completed/Met

## 2018-08-22 NOTE — Lactation Note (Addendum)
This note was copied from a baby's chart. Lactation Consultation Note  Patient Name: Kelli Barker RCVEL'F Date: 08/22/2018 Reason for consult: Follow-up assessment P1, 35 hour female infant, weight loss -1%, LGA greater than 9 lbs and mom had C/S delivery. Mom plans to continue with breastfeeding and not give infant formula. Mom had a bottle of Gerber formula sitting at her table but per mom, she not used formula/ Mom taught back hand expression, LC noticed colostrum was not present with hand expression, Mom will start using DEBP every 2 to 3 hours for breast stimulation and induction. Mom knows to Pump every  2-3 hours for 15 minutes on initial setting .  Mom shown how to use DEBP & how to disassemble, clean, & reassemble parts. LC entered room mom was asleep with infant in her bed, LC awaken mom and discussed risk factors with infant sleeping with mom, she said she understood and will call nurse if she is sleepy or need help putting infant in basinet.  Per mom, she has breast soreness, LC notice nipple stripe on left breast. Mom has been given a hand  pump and  coconut oil by Nurse for breast soreness. Mom latched infant on right breast using football hold, infant open mouth wide, nose and chin touching breast. Infant was still breastfeeding when LC left the room 22 minutes. Per mom,infant 's latch felt better and did not hurt no pain with latch. LC discussed with mom if she feels pain to break latch and re-latch infant to breast with good positing to help prevent or alleviate sore nipples.  Mom knows to call Nurse or [facility] if she has any questions, concerns or needs assistance with latching infant to breast. Mom will stop pacifier use, LC discussed risk factors with pacifier, such as possible nipple confusion , non-nutritive sucking , pacifier ususge may interfere with hunger cues.   Maternal Data Formula Feeding for Exclusion: No Has patient been taught Hand Expression?: Yes(Colostrum  is not present with hand expression LC asked mom start pumping with DEBP after latching infant to breast.) Does the patient have breastfeeding experience prior to this delivery?: No  Feeding Feeding Type: Breast Fed  LATCH Score Latch: Grasps breast easily, tongue down, lips flanged, rhythmical sucking.  Audible Swallowing: A few with stimulation  Type of Nipple: Everted at rest and after stimulation  Comfort (Breast/Nipple): Filling, red/small blisters or bruises, mild/mod discomfort(nipple stripe on left breast.)  Hold (Positioning): Assistance needed to correctly position infant at breast and maintain latch.  LATCH Score: 7  Interventions Interventions: Assisted with latch;Adjust position;Support pillows;Skin to skin;Position options;Hand express;Coconut oil;DEBP;Breast compression  Lactation Tools Discussed/Used Pump Review: Setup, frequency, and cleaning;Milk Storage Initiated by:: Delcie Roch, IBCLC Date initiated:: 08/22/18   Consult Status Consult Status: Follow-up Date: 08/22/18 Follow-up type: In-patient    Vicente Serene 08/22/2018, 4:04 AM

## 2018-08-22 NOTE — Plan of Care (Signed)
  Problem: Education: Goal: Knowledge of General Education information will improve Description: Including pain rating scale, medication(s)/side effects and non-pharmacologic comfort measures Outcome: Completed/Met   Problem: Clinical Measurements: Goal: Ability to maintain clinical measurements within normal limits will improve Outcome: Completed/Met Goal: Will remain free from infection Outcome: Completed/Met Goal: Diagnostic test results will improve Outcome: Completed/Met Goal: Respiratory complications will improve Outcome: Completed/Met Goal: Cardiovascular complication will be avoided Outcome: Completed/Met   Problem: Activity: Goal: Risk for activity intolerance will decrease Outcome: Completed/Met   Problem: Nutrition: Goal: Adequate nutrition will be maintained Outcome: Completed/Met   Problem: Coping: Goal: Level of anxiety will decrease Outcome: Completed/Met   Problem: Elimination: Goal: Will not experience complications related to bowel motility Outcome: Completed/Met Goal: Will not experience complications related to urinary retention Outcome: Completed/Met   Problem: Pain Managment: Goal: General experience of comfort will improve Outcome: Completed/Met   Problem: Safety: Goal: Ability to remain free from injury will improve Outcome: Completed/Met   Problem: Skin Integrity: Goal: Risk for impaired skin integrity will decrease Outcome: Completed/Met   Problem: Education: Goal: Knowledge of condition will improve Outcome: Completed/Met   Problem: Activity: Goal: Will verbalize the importance of balancing activity with adequate rest periods Outcome: Completed/Met Goal: Ability to tolerate increased activity will improve Outcome: Completed/Met   Problem: Life Cycle: Goal: Chance of risk for complications during the postpartum period will decrease Outcome: Completed/Met   Problem: Role Relationship: Goal: Ability to demonstrate positive  interaction with newborn will improve Outcome: Completed/Met   Problem: Skin Integrity: Goal: Demonstration of wound healing without infection will improve Outcome: Completed/Met

## 2018-08-22 NOTE — Progress Notes (Addendum)
Subjective: Postpartum Day 2: Cesarean Delivery Patient reports incisional pain, tolerating PO, + flatus, + BM and no problems voiding.    Objective: Vital signs in last 24 hours: Temp:  [97.7 F (36.5 C)-98.3 F (36.8 C)] 98.3 F (36.8 C) (08/08 0453) Pulse Rate:  [85-94] 85 (08/08 0453) Resp:  [18] 18 (08/08 0453) BP: (93-108)/(51-57) 105/57 (08/08 0453) SpO2:  [97 %-98 %] 98 % (08/07 1420)  Physical Exam:  General: alert, cooperative and no distress Lochia: appropriate Uterine Fundus: firm Incision: no significant drainage, wound vac Provena in place DVT Evaluation: No evidence of DVT seen on physical exam.  Recent Labs    08/20/18 1817 08/21/18 0427  HGB 8.9* 7.2*  HCT 28.1* 22.8*    Assessment/Plan: Status post Cesarean section. Doing well postoperatively.  S/P Fereheme infusion Continue current care.  Kelli Barker 08/22/2018, 8:30 AM

## 2018-08-22 NOTE — Lactation Note (Signed)
This note was copied from a baby's chart. Lactation Consultation Note  Patient Name: Boy Stepheny Canal WUXLK'[id number] Date: 08/22/2018  P1, 73 hour female infant. LGA with weight los of -4%. Per mom ,she feels breastfeeding is getting better infant is latching well on her right breast using football hold and is now breastfeeding for 30 minutes most feedings. She is still working with him latching on left breast due breast being sore.  Mom last breastfeed infant at 9 pm and supplemented with formula 40 ml afterwards, LC did not observe latch at this time. Mom started supplementing infant with formula earlier today. She has been breastfeeding infant first then offering formula after latching infant to breast infant is taking 40 to 50 mls per feeding. Mom plans to start pumping every 3 hours as advised earlier by [facility] she pumped 4 times today.  Per mom, no concerns today and feels everything is going well.   Maternal Data    Feeding    LATCH Score                   Interventions    Lactation Tools Discussed/Used     Consult Status      Vicente Serene 08/22/2018, 10:41 PM

## 2018-08-23 ENCOUNTER — Ambulatory Visit: Payer: Self-pay

## 2018-08-23 MED ORDER — IBUPROFEN 600 MG PO TABS: 600.0000 mg | ORAL_TABLET | Freq: Four times a day (QID) | ORAL | 1 refills | Status: DC

## 2018-08-23 MED ORDER — SENNOSIDES-DOCUSATE SODIUM 8.6-50 MG PO TABS: 2.0000 | ORAL_TABLET | ORAL | 0 refills | Status: DC

## 2018-08-23 MED ORDER — FERROUS SULFATE 324 (65 FE) MG PO TBEC: 1.0000 | DELAYED_RELEASE_TABLET | Freq: Two times a day (BID) | ORAL | 3 refills | Status: DC

## 2018-08-23 MED ORDER — OXYCODONE-ACETAMINOPHEN 5-325 MG PO TABS: 1.0000 | ORAL_TABLET | ORAL | 0 refills | Status: AC | PRN

## 2018-08-23 NOTE — Lactation Note (Signed)
This note was copied from a baby's chart. Lactation Consultation Note: Mother reports that she has a positional strip on her left nipple. Observed that mothers nipple is cracked and bleeding. Recommend that mother request [facility] RX  for her nipple. Mother reports that she thinks her milk has come infant. Assessed mothers breast and breast are slightly tender but soft. Mother is able to hand express colostrum.   Mother advised to continue to cue base feed infant. She was informed of S/S of Mastitis. Mother informed to feed infant 8-12 or more times in 24 hours. Mother receptive to all teaching. Mother has an electric pump and a hand pump. She is aware of available [facility] services. She would like to follow up with [facility] services.   Patient Name: Kelli Barker Date: 08/23/2018     Maternal Data    Feeding    LATCH Score                   Interventions    Lactation Tools Discussed/Used     Consult Status      Darla Lesches 08/23/2018, 4:36 PM

## 2018-08-24 ENCOUNTER — Other Ambulatory Visit: Payer: Self-pay | Admitting: Women's Health

## 2018-08-24 ENCOUNTER — Telehealth: Payer: Self-pay | Admitting: Obstetrics and Gynecology

## 2018-08-24 NOTE — Telephone Encounter (Signed)
Pt states that the lactation consultant stated for her to call her OB and ask for all purpose nipple cream to be sent in to [place] so that she can continue to breastfeed.

## 2018-08-27 ENCOUNTER — Encounter: Payer: Medicaid Other | Admitting: Obstetrics and Gynecology

## 2018-08-27 ENCOUNTER — Other Ambulatory Visit: Payer: Self-pay

## 2018-08-27 ENCOUNTER — Other Ambulatory Visit: Payer: Self-pay | Admitting: *Deleted

## 2018-08-27 MED ORDER — BLOOD PRESSURE MONITOR MISC: 0 refills | Status: DC

## 2018-09-03 DIAGNOSIS — Z7689 Persons encountering health services in other specified circumstances: Secondary | ICD-10-CM | POA: Diagnosis not present

## 2018-09-07 ENCOUNTER — Other Ambulatory Visit: Payer: Self-pay

## 2018-09-07 ENCOUNTER — Encounter: Payer: Self-pay | Admitting: Obstetrics and Gynecology

## 2018-09-07 ENCOUNTER — Ambulatory Visit (INDEPENDENT_AMBULATORY_CARE_PROVIDER_SITE_OTHER): Payer: Medicaid Other | Admitting: Obstetrics and Gynecology

## 2018-09-07 VITALS — BP 120/80 | HR 87 | Ht 60.0 in | Wt 232.6 lb

## 2018-09-07 DIAGNOSIS — Z98891 History of uterine scar from previous surgery: Secondary | ICD-10-CM

## 2018-09-07 DIAGNOSIS — Z7689 Persons encountering health services in other specified circumstances: Secondary | ICD-10-CM | POA: Diagnosis not present

## 2018-09-07 NOTE — Progress Notes (Signed)
Patient ID: Kelli Barker, female   DOB: Feb 20, 1990, 28 y.o.   MRN: [id number]  Subjective:  Kelli Barker is a 28 y.o. female now 2 weeks & 4 days status post c-section w/ tubal.    occasion discomfort from umbilical hernia. Takes ibuprofen every six hours for minimal pain. If has severe pain in hips or cramping will take percocet only at night, says she doesn't take I every 4 hours  Review of Systems Negative except umbilical hernia   Diet:   normal   Bowel movements : normal.  Pain is controlled with current analgesics. Medications being used: ibuprofen (OTC) and narcotic analgesics including oxycodone/acetaminophen (Percocet, Tylox).  Objective:  There were no vitals taken for this visit. General:Well developed, well nourished.  No acute distress. Abdomen: Bowel sounds normal, soft, non-tender., 1 cm umbilical hernia with some incarcerated fat 2 cm diameter unable to reduce it for pt. Pelvic Exam: Not examined  Incision(s): Healing , no drainage, no erythema, no hernia, no swelling, no dehiscence,}  Assessment:  Post-Op 2 weeks & 4 days s/p c-section w/ tubal   Umbilical hernia Doing well postoperatively.   Plan:  1. Activity restrictions: none 2. return to work: not applicable. 3. Follow up in 3 weeks for PP   By signing my name below, I, Samul Dada, attest that this documentation has been prepared under the direction and in the presence of Jonnie Kind, MD. Electronically Signed: [place]. 09/07/18. 12:01 PM.  I personally performed the services described in this documentation, which was SCRIBED in my presence. The recorded information has been reviewed and considered accurate. It has been edited as necessary during review. Jonnie Kind, MD

## 2018-09-08 DIAGNOSIS — Z7689 Persons encountering health services in other specified circumstances: Secondary | ICD-10-CM | POA: Diagnosis not present

## 2018-09-16 ENCOUNTER — Encounter: Payer: Self-pay | Admitting: Obstetrics and Gynecology

## 2018-09-16 ENCOUNTER — Other Ambulatory Visit: Payer: Self-pay

## 2018-09-16 ENCOUNTER — Encounter: Payer: Medicaid Other | Admitting: Obstetrics and Gynecology

## 2018-09-16 ENCOUNTER — Ambulatory Visit (INDEPENDENT_AMBULATORY_CARE_PROVIDER_SITE_OTHER): Payer: Medicaid Other | Admitting: Obstetrics and Gynecology

## 2018-09-16 VITALS — BP 102/71 | HR 63 | Ht 60.0 in | Wt 237.0 lb

## 2018-09-16 DIAGNOSIS — F418 Other specified anxiety disorders: Secondary | ICD-10-CM | POA: Diagnosis not present

## 2018-09-16 DIAGNOSIS — D649 Anemia, unspecified: Secondary | ICD-10-CM | POA: Diagnosis not present

## 2018-09-16 LAB — POCT HEMOGLOBIN: Hemoglobin: 11.1 g/dL (ref 11–14.6)

## 2018-09-16 MED ORDER — LORAZEPAM 0.5 MG PO TABS: 0.5000 mg | ORAL_TABLET | Freq: Every day | ORAL | 0 refills | Status: DC | PRN

## 2018-09-16 NOTE — Progress Notes (Signed)
Patient ID: Kelli Barker, female   DOB: 28-Feb-1990, 28 y.o.   MRN: [id number]   Subjective:    Kelli Barker is a 28 y.o. [id number] Caucasian female who presents for a postpartum visit. She is 3 weeks & 3 days postpartum following a primary cesarean section, low transverse incision at 41 gestational weeks. Anesthesia: epidural. I have fully reviewed the prenatal and intrapartum course. Postpartum course has been unwell. Is here to discuss PP anxiety and depression. Is first time experiencing PP depression. Is living with her finace. This last child is current partner only child together, other 3 children are with a different partner in [place]. Before everything everything was fine. Is more anxious due to virtual learning of children and has lack of change of scenery and is becomes overwhelmed. Found out partner had cancer in April, was stage 4 lymphoma. Fiance is still getting paid and thankfully hasn't been let go from job. Partners family comes by but doesn't help out much. Feels more anxiety and has more OCD tendencies with handwashing. Gets emotionally overwhelmed and will have to walk away to cry which relieves a bit of stress. Is often worried if newborn will be okay. Doesn't get much sleep, and wakes up exhausted. Often stands over the baby to see if they are breathing. Denies wanting to harm self or others. Has been dizzy and disoriented, no leg or arm weakness, no changes in vision and some yellow-ish discharge since she has resumed menses Baby's course has been well. Baby is feeding by bottle - gerber- good start.  Bowel function is normal. Bladder function is normal. Patient is not sexually active. Last sexual activity: N/A. Contraception method is tubal ligation. Postpartum depression screening: positive. Score 15. Hgb 11.1  The following portions of the patient's history were reviewed and updated as appropriate: allergies, current medications, past medical history, past surgical history and  problem list.  Review of Systems Pertinent items are noted in HPI.   Vitals:   09/16/18 0845  BP: 102/71  Pulse: 63  Weight: 237 lb (107.5 kg)  Height: 5' (1.524 m)    Objective:   General:  alert, cooperative and no distress   Breasts:  deferred, no complaints  Lungs: clear to auscultation bilaterally  Heart:  regular rate and rhythm  Abdomen: soft, nontender   Vulva: normal  Vagina: normal vagina  Cervix:  closed  Corpus: Well-involuted  Adnexa:  Non-palpable  Rectal Exam: Not examined        Assessment:  Postpartum counseling 0.5 mg ativan daily, f/u 1 week F/u with recommendation to counseling  3 wks & 4 days s/p low transverse c-section  bottlefeeding Depression screening Recommendation for owlet pulse monitor  Plan:  : tubal ligation Follow up in: 1 week for PP tele visit f/u  By signing my name below, I, Samul Dada, attest that this documentation has been prepared under the direction and in the presence of Jonnie Kind, MD. Electronically Signed: [place]. 09/16/18. 9:01 AM.  I personally performed the services described in this documentation, which was SCRIBED in my presence. The recorded information has been reviewed and considered accurate. It has been edited as necessary during review. Jonnie Kind, MD

## 2018-09-23 ENCOUNTER — Encounter: Payer: Self-pay | Admitting: Obstetrics and Gynecology

## 2018-09-23 ENCOUNTER — Telehealth (INDEPENDENT_AMBULATORY_CARE_PROVIDER_SITE_OTHER): Payer: Medicaid Other | Admitting: Obstetrics and Gynecology

## 2018-09-23 ENCOUNTER — Other Ambulatory Visit: Payer: Self-pay

## 2018-09-23 VITALS — Ht 62.0 in

## 2018-09-23 DIAGNOSIS — F418 Other specified anxiety disorders: Secondary | ICD-10-CM

## 2018-09-23 MED ORDER — ESCITALOPRAM OXALATE 10 MG PO TABS: 10.0000 mg | ORAL_TABLET | Freq: Every day | ORAL | 5 refills | Status: AC

## 2018-09-23 NOTE — Progress Notes (Signed)
Patient ID: Kelli Barker, female   DOB: 1990-12-12, 28 y.o.   MRN: [id number]    TELEHEALTH VIRTUAL GYNECOLOGY VISIT ENCOUNTER NOTE  I connected with Kelli BelfastJessica Murry on 09/23/2018 at 10:30 AM EDT by telephone at home and verified that I am speaking with the correct person using two identifiers.   I discussed the limitations, risks, security and privacy concerns of performing an evaluation and management service by telephone and the availability of in person appointments. I also discussed with the patient that there may be a patient responsible charge related to this service. The patient expressed understanding and agreed to proceed.   History:  Kelli BelfastJessica Faison is a 28 y.o. [id number] female being evaluated today for medication f/u. Pt was having PP anxiety and exhaustion. Was prescribed ativan 0.5 mg tablet.  She took the medicine at bedtime the first couple of nights and then taken the mornings.  It does give her some drowsiness a couple hours later which helps her with her sleep.  She is still able to take care of the baby.  She is switched to take it in the mornings as she was waking up with a headache that was top of the head without visual component sounding a little bit like a tension headache since the switch to the morning she notices little bit of drowsiness later in the day, as expected, and occasional headaches persist so we will discontinue the Ativan I have recommended that she be tried on an SSRI if she agrees to this we will begin Lexapro 10 mg daily     she denies suicidal or homicidal intent She has support from her "adopted grandfather" whom she can talk with freely.  She speaks freely and smiles during her visit today she seems quite animated.  She denies any headache at present.  There is no visual component to the headache   Past Medical History:  Diagnosis Date  . Anxiety   . Depression   . Herpes genitalia    Past Surgical History:  Procedure Laterality Date  . CESAREAN  SECTION N/A 08/20/2018   Procedure: CESAREAN SECTION;  Surgeon: Tereso NewcomerAnyanwu, Ugonna A, MD;  Location: [facility] LD ORS;  Service: Obstetrics;  Laterality: N/A;  . CHOLECYSTECTOMY  08-2008  . TUBAL LIGATION  08/20/2018   Procedure: BILATERAL TUBAL LIGATION;  Surgeon: Tereso NewcomerAnyanwu, Ugonna A, MD;  Location: [facility] LD ORS;  Service: Obstetrics;;   The following portions of the patient's history were reviewed and updated as appropriate: allergies, current medications, past family history, past medical history, past social history, past surgical history and problem list.   Review of Systems:  Pertinent items noted in HPI and remainder of comprehensive ROS otherwise negative.  Physical Exam:   General:  Alert, oriented and cooperative.   Mental Status: Normal mood and affect perceived. Normal judgment and thought content.  Physical exam deferred due to nature of the encounter  Labs and Imaging Results for orders placed or performed in visit on 09/16/18 (from the past 336 hour(s))  POCT hemoglobin   Collection Time: 09/16/18  8:56 AM  Result Value Ref Range   Hemoglobin 11.1 11 - 14.6 g/dL   No results found.    Assessment and Plan:  1.  Situational stress related to parenting postpartum baby responsibilities, and partners diagnosis of lymphoma   2.  We will discontinue Ativan 3.  Prescription for Lexapro 10 mg p.o. daily to be taken at bedtime written for patient.  Refill x5   I discussed the assessment  and treatment plan with the patient. The patient was provided an opportunity to ask questions and all were answered. The patient agreed with the plan and demonstrated an understanding of the instructions.   The patient was advised to call back or seek an in-person evaluation/go to the ED if the symptoms worsen or if the condition fails to improve as anticipated.  I provided 10 minutes of non-face-to-face time during this encounter.   By signing my name below, I, Samul Dada, attest that this documentation has  been prepared under the direction and in the presence of Jonnie Kind, MD. Electronically Signed: [place]. 09/23/18. 10:02 AM.  I personally performed the services described in this documentation, which was SCRIBED in my presence. The recorded information has been reviewed and considered accurate. It has been edited as necessary during review. Jonnie Kind, MD

## 2018-09-24 ENCOUNTER — Encounter: Payer: Self-pay | Admitting: Advanced Practice Midwife

## 2018-09-24 ENCOUNTER — Telehealth (INDEPENDENT_AMBULATORY_CARE_PROVIDER_SITE_OTHER): Payer: Medicaid Other | Admitting: Advanced Practice Midwife

## 2018-09-24 MED ORDER — FLUCONAZOLE 150 MG PO TABS: 150.0000 mg | ORAL_TABLET | Freq: Every day | ORAL | 0 refills | Status: AC

## 2018-09-24 NOTE — Patient Instructions (Signed)
OTC antifungal cream or powder to red area Keep dry

## 2018-09-24 NOTE — Progress Notes (Signed)
TELEHEALTH VIRTUAL POSTPARTUM VISIT ENCOUNTER NOTE  I connected with@ on 09/24/18 at 10:50 AM EDT by telephone at home and verified that I am speaking with the correct person using two identifiers.   I discussed the limitations, risks, security and privacy concerns of performing an evaluation and management service by telephone and the availability of in person appointments. I also discussed with the patient that there may be a patient responsible charge related to this service. The patient expressed understanding and agreed to proceed.  Appointment Date: 09/24/2018  OBGYN Clinic: [facility]  Chief Complaint:  Postpartum Visit  History of Present Illness: Anthonia Barker is a 28 y.o. Caucasian [id number] (No LMP recorded.), seen for the above chief complaint. Her past medical history is significant for anxiety/depression/HSV   She is s/p primary cesarean section on 8/5 at 41.1 weeks; Failed IOL/NRFHT/macrosomia she was discharged to home on POD#3. Pregnancy complicated by nothing (normal gtt). Baby is doing well.  Complains of burning redness around incision.  Visit w/[facility] yesterday for anxiety/depression. Hasn't picked up meds yet but plans to today.  Vaginal bleeding or discharge: No  Mode of feeding infant: Bottle Intercourse: No  Contraception: bilateral tubal ligation PP depression s/s: Yes .  Any bowel or bladder issues: some constipation, handled w/diet Pap smear: no abnormalities (date: 05/29/18)  Review of Systems: Her 12 point review of systems is negative or as noted in the History of Present Illness.  Patient Active Problem List   Diagnosis Date Noted  . Anemia in pregnancy, third trimester 08/20/2018  . Postoperative anemia due to acute blood loss 08/20/2018  . S/P cesarean section 08/19/2018  . 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 Asymptomatic bacteriuria during pregnancy in third trimester 06/01/2018  . Encounter for contraceptive management  05/15/2018  . Left knee pain 03/01/2015  . Depression with anxiety 03/01/2015  . Maternal morbid obesity, antepartum ([place]) 03/01/2015  . Cigarette nicotine dependence, uncomplicated 56/43/3295    Medications Camella Seim had no medications administered during this visit. Current Outpatient Medications  Medication Sig Dispense Refill  . ibuprofen (ADVIL) 200 MG tablet Take 600 mg by mouth as needed.    Marland Kitchen LYSINE PO Take by mouth 2 (two) times daily as needed.    Marland Kitchen oxyCODONE-acetaminophen (PERCOCET/ROXICET) 5-325 MG tablet Take 1 tablet by mouth every 4 (four) hours as needed for moderate pain. Kelli tablet 0  . Prenatal Vit-Fe Fumarate-FA (MULTIVITAMIN-PRENATAL) 27-0.8 MG TABS tablet Take 1 tablet by mouth 2 (two) times daily.     Marland Kitchen escitalopram (LEXAPRO) 10 MG tablet Take 1 tablet (10 mg total) by mouth daily. Kelli tablet 5  . fluconazole (DIFLUCAN) 150 MG tablet Take 1 tablet (150 mg total) by mouth daily for 5 days. 5 tablet 0   No current facility-administered medications for this visit.     Allergies Sulfa antibiotics  Physical Exam:  General:  Alert, oriented and cooperative.   Mental Status: Normal mood and affect perceived. Normal judgment and thought content.  Rest of physical exam deferred due to type of encounter  PP Depression Screening:   Edinburgh Postnatal Depression Scale - 09/24/18 1100      Edinburgh Postnatal Depression Scale:  In the Past 7 Days   I have been able to laugh and see the funny side of things.  1    I have looked forward with enjoyment to things.  1    I have blamed myself unnecessarily when things went wrong.  3  I have been anxious or worried for no good reason.  2    I have felt scared or panicky for no good reason.  3    Things have been getting on top of me.  2    I have been so unhappy that I have had difficulty sleeping.  1    I have felt sad or miserable.  1    I have been so unhappy that I have been crying.  1    The thought of  harming myself has occurred to me.  0    Edinburgh Postnatal Depression Scale Total  15       Assessment:Patient is a Kelli Barker who is 4 weeks postpartum from a primary cesarean section.  She is doing well.   Plan: Pick up meds today Photo of incision show yeast Rx diflucan and get topical antifungal powder, keep dry There are no diagnoses linked to this encounter.  RTC 2-3 weeks for video med check  I discussed the assessment and treatment plan with the patient. The patient was provided an opportunity to ask questions and all were answered. The patient agreed with the plan and demonstrated an understanding of the instructions.   The patient was advised to call back or seek an in-person evaluation/go to the ED for any concerning postpartum symptoms.  I provided 15 minutes of non-face-to-face time during this encounter.   Jacklyn ShellFrances Cresenzo-Dishmon, CNM [facility], [facility]

## 2018-10-08 ENCOUNTER — Telehealth: Payer: Medicaid Other | Admitting: Obstetrics and Gynecology

## 2019-02-24 ENCOUNTER — Telehealth: Payer: Self-pay | Admitting: *Deleted

## 2019-02-24 MED ORDER — ACYCLOVIR 400 MG PO TABS: 400.0000 mg | ORAL_TABLET | Freq: Two times a day (BID) | ORAL | 3 refills | Status: DC

## 2019-02-24 NOTE — Telephone Encounter (Signed)
Patient left message on nurse line that she is having a "medical problem" and would like to speak with someone.   Returned patient's call.  States she is having an outbreak and is requesting Acyclovir be sent to her pharmacy. Advised to check back with her pharmacy later today unless she hears back from [place].

## 2019-02-24 NOTE — Telephone Encounter (Signed)
rx acyclovir for HSV

## 2019-02-24 NOTE — Addendum Note (Signed)
Addended by: Cyril Mourning A on: 02/24/2019 02:52 PM   Modules accepted: Orders

## 2019-04-13 ENCOUNTER — Ambulatory Visit (INDEPENDENT_AMBULATORY_CARE_PROVIDER_SITE_OTHER): Payer: Medicaid Other | Admitting: Nurse Practitioner

## 2019-05-17 ENCOUNTER — Ambulatory Visit (INDEPENDENT_AMBULATORY_CARE_PROVIDER_SITE_OTHER): Payer: Medicaid Other | Admitting: Nurse Practitioner

## 2019-05-17 ENCOUNTER — Encounter (INDEPENDENT_AMBULATORY_CARE_PROVIDER_SITE_OTHER): Payer: Self-pay

## 2019-05-17 ENCOUNTER — Other Ambulatory Visit: Payer: Self-pay

## 2019-05-17 VITALS — Ht 62.0 in | Wt 248.4 lb

## 2019-05-17 DIAGNOSIS — R05 Cough: Secondary | ICD-10-CM

## 2019-05-17 NOTE — Progress Notes (Addendum)
This patient arrives today for a new patient appointment.  Unfortunately she was experiencing signs of COVID-19.  She had a probable exposure as well as she states that her children were diagnosed with COVID-19 within the last 3 weeks.  There was some confusion regarding whether or not the children were diagnosed 3 weeks versus 10 days ago.  For this reason to protect staff and other patients the patient was asked to leave the appointment, be tested for COVID-19, and call the office back to reschedule this appointment.  I did provide her with the phone number to call us that she could get an appointment scheduled for COVID-19 testing.  The above situation was also discussed with Dr. Karilyn Cota, and he was agreeable to the above.

## 2019-05-18 ENCOUNTER — Other Ambulatory Visit: Payer: Medicaid Other

## 2019-07-20 DIAGNOSIS — A599 Trichomoniasis, unspecified: Secondary | ICD-10-CM | POA: Diagnosis not present

## 2019-07-20 DIAGNOSIS — R309 Painful micturition, unspecified: Secondary | ICD-10-CM | POA: Diagnosis not present

## 2019-07-20 DIAGNOSIS — R350 Frequency of micturition: Secondary | ICD-10-CM | POA: Diagnosis not present

## 2019-09-10 ENCOUNTER — Telehealth: Payer: Self-pay | Admitting: Obstetrics and Gynecology

## 2019-09-10 ENCOUNTER — Other Ambulatory Visit: Payer: Self-pay | Admitting: Obstetrics and Gynecology

## 2019-09-10 MED ORDER — ACYCLOVIR 400 MG PO TABS: 400.0000 mg | ORAL_TABLET | Freq: Two times a day (BID) | ORAL | 3 refills | Status: AC

## 2019-09-10 NOTE — Progress Notes (Signed)
Acyclovir Rx refilled. Pt may be a candidate for continuous supressive tx.

## 2019-09-10 NOTE — Telephone Encounter (Signed)
Telephoned patient at home number. Patient states would like refill on Acyclovir. Advised patient to check with pharmacy later today, if not heard back from [place].

## 2019-09-10 NOTE — Telephone Encounter (Signed)
Patient called stating that she is needing to speak with a nurse regarding one of her medication. Pease contact pt

## 2020-04-07 IMAGING — DX DG CHEST 2V
2 series · 2 of 2 positions shown · non-contrast
Comparison: October 23, 2017

CLINICAL DATA: Cough and fever

EXAM:
CHEST - 2 VIEW

[chest pa]
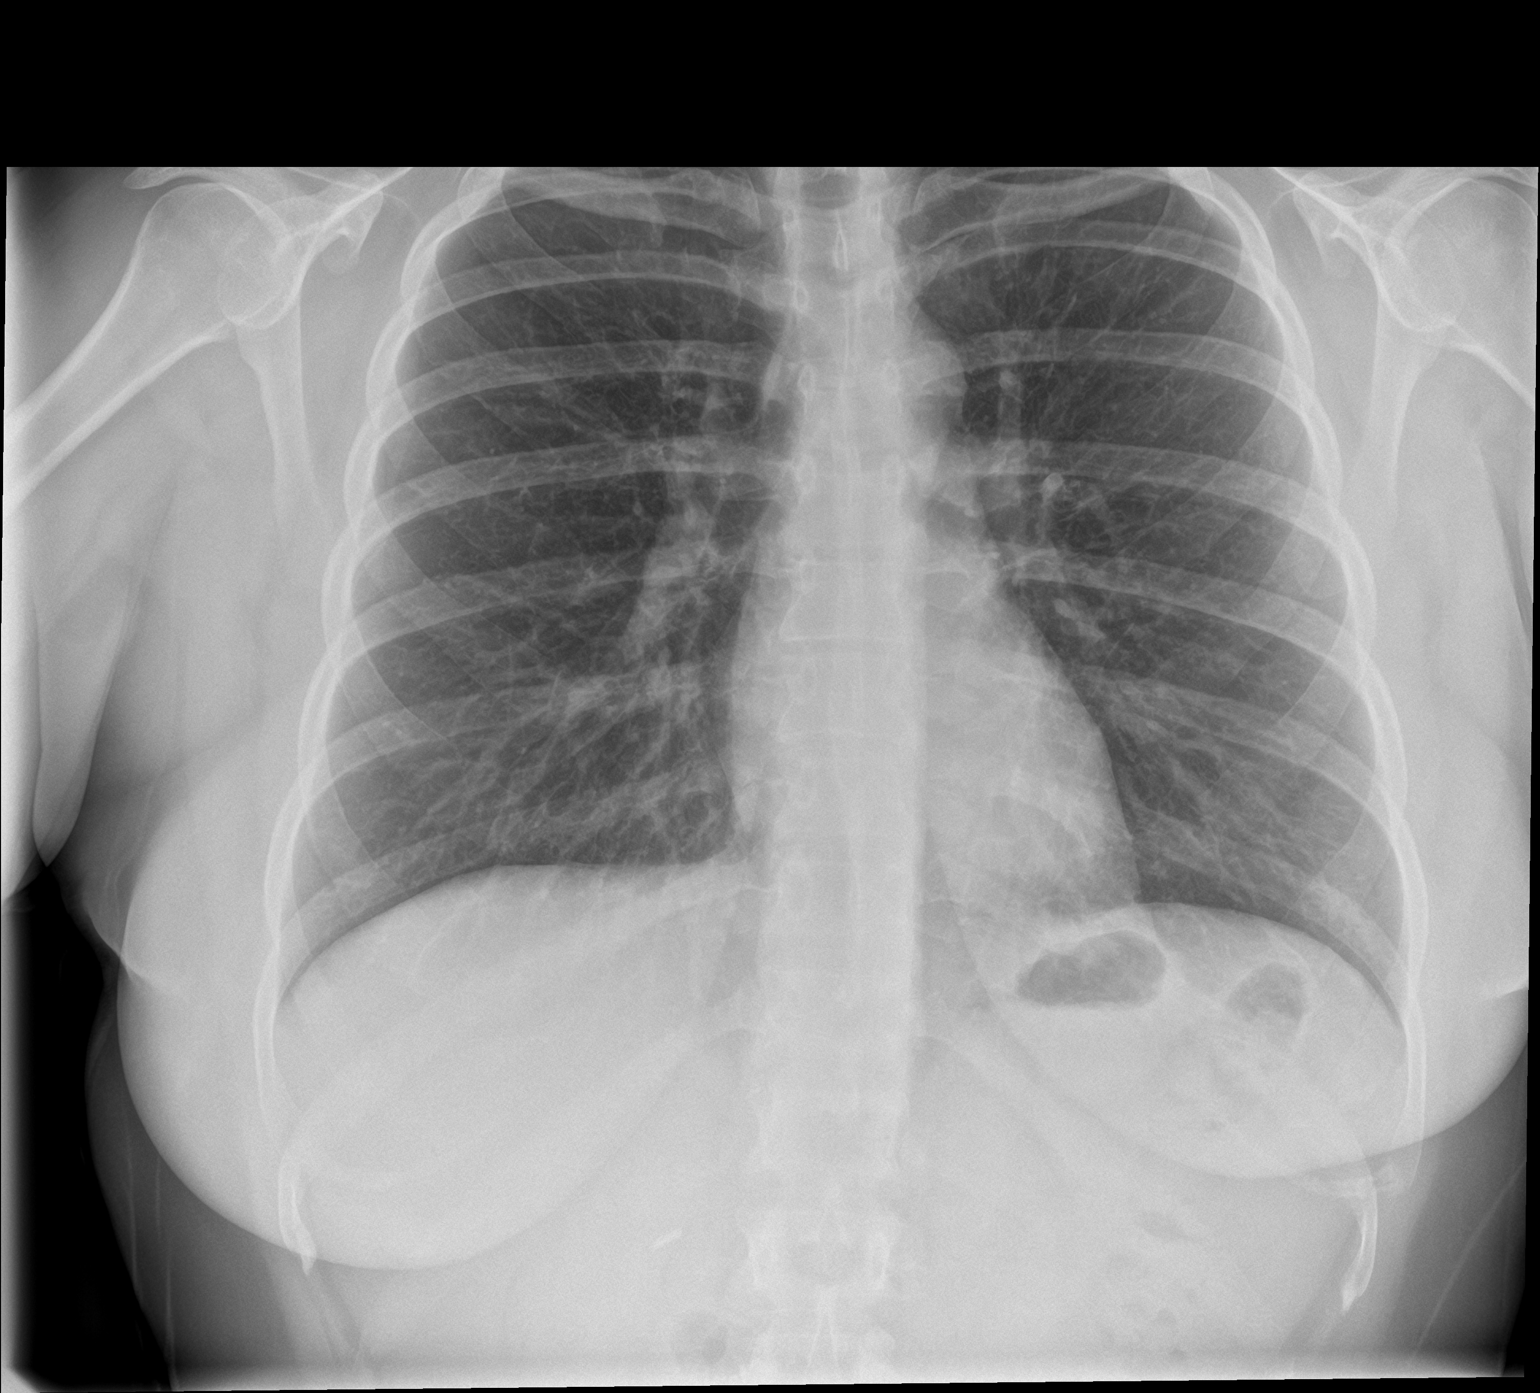

[chest lat]
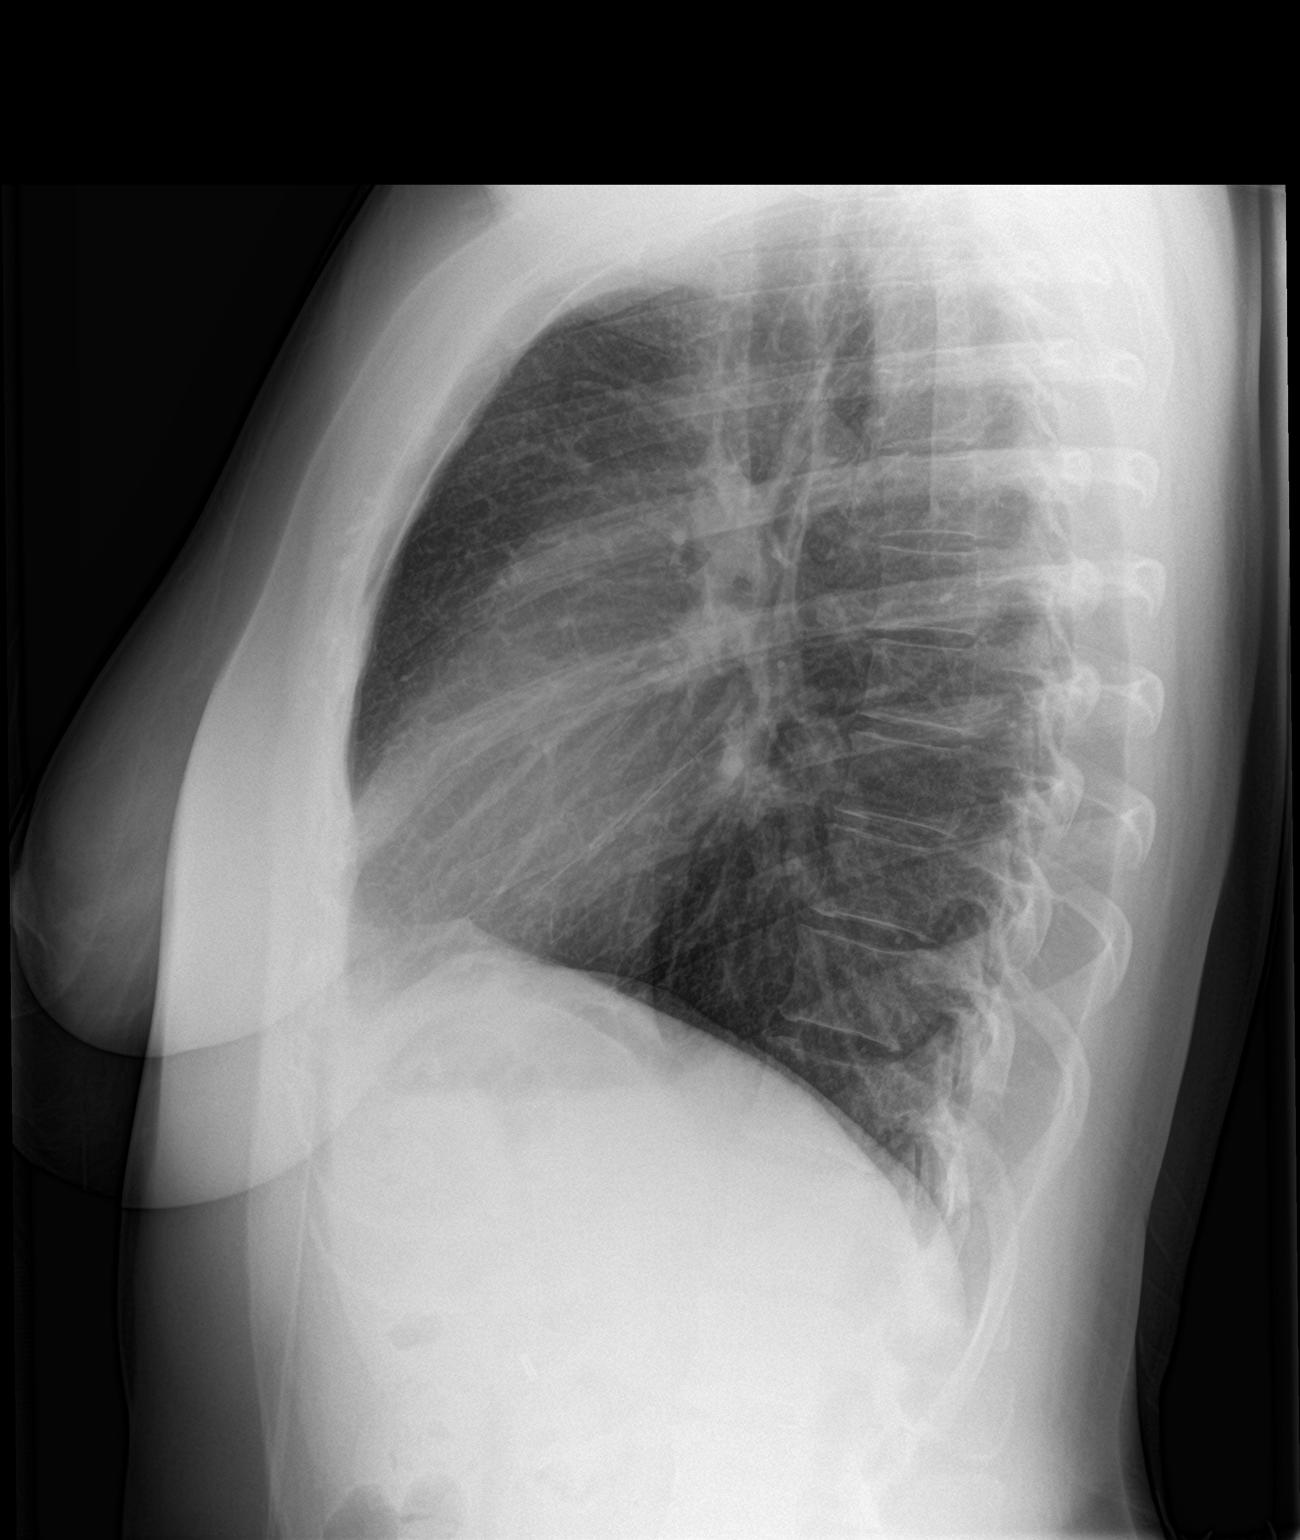

[2 of 2 positions shown; findings below may reference images not displayed]

FINDINGS: Lungs are clear. Heart size and pulmonary vascularity are normal. No
adenopathy. No bone lesions.
IMPRESSION: No edema or consolidation.

## 2021-03-12 DIAGNOSIS — H60501 Unspecified acute noninfective otitis externa, right ear: Secondary | ICD-10-CM | POA: Diagnosis not present

## 2021-03-12 DIAGNOSIS — J069 Acute upper respiratory infection, unspecified: Secondary | ICD-10-CM | POA: Diagnosis not present

## 2021-03-12 DIAGNOSIS — K529 Noninfective gastroenteritis and colitis, unspecified: Secondary | ICD-10-CM | POA: Diagnosis not present

## 2021-03-12 DIAGNOSIS — H6691 Otitis media, unspecified, right ear: Secondary | ICD-10-CM | POA: Diagnosis not present

## 2021-04-26 ENCOUNTER — Ambulatory Visit: Payer: Medicaid Other | Admitting: Nurse Practitioner
